# Patient Record
Sex: Female | Born: 1945 | Race: White | Hispanic: No | Marital: Single | Smoking: Former smoker
Health system: Southern US, Community
[De-identification: ages and names within clinical notes are randomized; demographics above are authoritative.]

## PROBLEM LIST (undated history)

## (undated) DIAGNOSIS — H6240 Otitis externa in other diseases classified elsewhere, unspecified ear: Secondary | ICD-10-CM

## (undated) DIAGNOSIS — R748 Abnormal levels of other serum enzymes: Secondary | ICD-10-CM

## (undated) DIAGNOSIS — M7750 Other enthesopathy of unspecified foot: Secondary | ICD-10-CM

## (undated) DIAGNOSIS — G4733 Obstructive sleep apnea (adult) (pediatric): Secondary | ICD-10-CM

## (undated) DIAGNOSIS — E119 Type 2 diabetes mellitus without complications: Secondary | ICD-10-CM

## (undated) DIAGNOSIS — E669 Obesity, unspecified: Secondary | ICD-10-CM

## (undated) DIAGNOSIS — E1129 Type 2 diabetes mellitus with other diabetic kidney complication: Secondary | ICD-10-CM

## (undated) DIAGNOSIS — G8929 Other chronic pain: Secondary | ICD-10-CM

## (undated) DIAGNOSIS — S93499A Sprain of other ligament of unspecified ankle, initial encounter: Secondary | ICD-10-CM

## (undated) DIAGNOSIS — F329 Major depressive disorder, single episode, unspecified: Secondary | ICD-10-CM

## (undated) DIAGNOSIS — M1990 Unspecified osteoarthritis, unspecified site: Secondary | ICD-10-CM

## (undated) DIAGNOSIS — K219 Gastro-esophageal reflux disease without esophagitis: Secondary | ICD-10-CM

## (undated) DIAGNOSIS — M25579 Pain in unspecified ankle and joints of unspecified foot: Secondary | ICD-10-CM

## (undated) DIAGNOSIS — M549 Dorsalgia, unspecified: Secondary | ICD-10-CM

## (undated) DIAGNOSIS — N183 Chronic kidney disease, stage 3 unspecified: Secondary | ICD-10-CM

## (undated) DIAGNOSIS — F419 Anxiety disorder, unspecified: Secondary | ICD-10-CM

## (undated) DIAGNOSIS — R0989 Other specified symptoms and signs involving the circulatory and respiratory systems: Secondary | ICD-10-CM

## (undated) DIAGNOSIS — C801 Malignant (primary) neoplasm, unspecified: Secondary | ICD-10-CM

## (undated) DIAGNOSIS — C52 Malignant neoplasm of vagina: Secondary | ICD-10-CM

## (undated) DIAGNOSIS — R21 Rash and other nonspecific skin eruption: Secondary | ICD-10-CM

## (undated) DIAGNOSIS — M898X9 Other specified disorders of bone, unspecified site: Secondary | ICD-10-CM

## (undated) DIAGNOSIS — H9319 Tinnitus, unspecified ear: Secondary | ICD-10-CM

## (undated) DIAGNOSIS — C4490 Unspecified malignant neoplasm of skin, unspecified: Secondary | ICD-10-CM

## (undated) DIAGNOSIS — E7800 Pure hypercholesterolemia, unspecified: Secondary | ICD-10-CM

## (undated) DIAGNOSIS — T792XXA Traumatic secondary and recurrent hemorrhage and seroma, initial encounter: Secondary | ICD-10-CM

## (undated) DIAGNOSIS — F29 Unspecified psychosis not due to a substance or known physiological condition: Secondary | ICD-10-CM

## (undated) DIAGNOSIS — F339 Major depressive disorder, recurrent, unspecified: Secondary | ICD-10-CM

## (undated) DIAGNOSIS — R1312 Dysphagia, oropharyngeal phase: Secondary | ICD-10-CM

## (undated) DIAGNOSIS — F32A Depression, unspecified: Secondary | ICD-10-CM

## (undated) DIAGNOSIS — I10 Essential (primary) hypertension: Secondary | ICD-10-CM

## (undated) DIAGNOSIS — L299 Pruritus, unspecified: Secondary | ICD-10-CM

## (undated) DIAGNOSIS — G2581 Restless legs syndrome: Secondary | ICD-10-CM

## (undated) DIAGNOSIS — S96819A Strain of other specified muscles and tendons at ankle and foot level, unspecified foot, initial encounter: Secondary | ICD-10-CM

## (undated) DIAGNOSIS — T8859XA Other complications of anesthesia, initial encounter: Secondary | ICD-10-CM

## (undated) DIAGNOSIS — L408 Other psoriasis: Secondary | ICD-10-CM

## (undated) DIAGNOSIS — Z720 Tobacco use: Secondary | ICD-10-CM

## (undated) DIAGNOSIS — H9110 Presbycusis, unspecified ear: Secondary | ICD-10-CM

## (undated) DIAGNOSIS — R0609 Other forms of dyspnea: Secondary | ICD-10-CM

## (undated) DIAGNOSIS — G43909 Migraine, unspecified, not intractable, without status migrainosus: Secondary | ICD-10-CM

## (undated) DIAGNOSIS — B369 Superficial mycosis, unspecified: Secondary | ICD-10-CM

## (undated) DIAGNOSIS — R51 Headache: Secondary | ICD-10-CM

## (undated) DIAGNOSIS — M175 Other unilateral secondary osteoarthritis of knee: Secondary | ICD-10-CM

## (undated) DIAGNOSIS — H60399 Other infective otitis externa, unspecified ear: Secondary | ICD-10-CM

## (undated) DIAGNOSIS — F319 Bipolar disorder, unspecified: Secondary | ICD-10-CM

## (undated) DIAGNOSIS — N390 Urinary tract infection, site not specified: Secondary | ICD-10-CM

## (undated) DIAGNOSIS — T4145XA Adverse effect of unspecified anesthetic, initial encounter: Secondary | ICD-10-CM

## (undated) HISTORY — DX: Restless legs syndrome: G25.81

## (undated) HISTORY — PX: FINGER AMPUTATION: SHX636

## (undated) HISTORY — DX: Chronic kidney disease, stage 3 (moderate): N18.3

## (undated) HISTORY — DX: Major depressive disorder, single episode, unspecified: F32.9

## (undated) HISTORY — DX: Other enthesopathy of unspecified foot and ankle: M77.50

## (undated) HISTORY — DX: Presbycusis, unspecified ear: H91.10

## (undated) HISTORY — DX: Dysphagia, oropharyngeal phase: R13.12

## (undated) HISTORY — DX: Other unilateral secondary osteoarthritis of knee: M17.5

## (undated) HISTORY — DX: Pure hypercholesterolemia, unspecified: E78.00

## (undated) HISTORY — DX: Obesity, unspecified: E66.9

## (undated) HISTORY — DX: Other specified symptoms and signs involving the circulatory and respiratory systems: R09.89

## (undated) HISTORY — DX: Type 2 diabetes mellitus without complications: E11.9

## (undated) HISTORY — DX: Abnormal levels of other serum enzymes: R74.8

## (undated) HISTORY — DX: Malignant (primary) neoplasm, unspecified: C80.1

## (undated) HISTORY — DX: Superficial mycosis, unspecified: B36.9

## (undated) HISTORY — DX: Tinnitus, unspecified ear: H93.19

## (undated) HISTORY — DX: Strain of other specified muscles and tendons at ankle and foot level, unspecified foot, initial encounter: S96.819A

## (undated) HISTORY — DX: Type 2 diabetes mellitus with other diabetic kidney complication: E11.29

## (undated) HISTORY — DX: Otitis externa in other diseases classified elsewhere, unspecified ear: H62.40

## (undated) HISTORY — DX: Pain in unspecified ankle and joints of unspecified foot: M25.579

## (undated) HISTORY — DX: Other forms of dyspnea: R06.09

## (undated) HISTORY — DX: Anxiety disorder, unspecified: F41.9

## (undated) HISTORY — DX: Migraine, unspecified, not intractable, without status migrainosus: G43.909

## (undated) HISTORY — DX: Chronic kidney disease, stage 3 unspecified: N18.30

## (undated) HISTORY — DX: Strain of other specified muscles and tendons at ankle and foot level, unspecified foot, initial encounter: S93.499A

## (undated) HISTORY — DX: Malignant neoplasm of vagina: C52

## (undated) HISTORY — DX: Other specified disorders of bone, unspecified site: M89.8X9

## (undated) HISTORY — DX: Pruritus, unspecified: L29.9

## (undated) HISTORY — DX: Obstructive sleep apnea (adult) (pediatric): G47.33

## (undated) HISTORY — PX: BLADDER SURGERY: SHX569

## (undated) HISTORY — PX: NASAL SEPTUM SURGERY: SHX37

## (undated) HISTORY — DX: Other chronic pain: G89.29

## (undated) HISTORY — DX: Gastro-esophageal reflux disease without esophagitis: K21.9

## (undated) HISTORY — DX: Essential (primary) hypertension: I10

## (undated) HISTORY — DX: Depression, unspecified: F32.A

## (undated) HISTORY — DX: Other infective otitis externa, unspecified ear: H60.399

## (undated) HISTORY — DX: Dorsalgia, unspecified: M54.9

## (undated) HISTORY — DX: Unspecified psychosis not due to a substance or known physiological condition: F29

## (undated) HISTORY — DX: Other psoriasis: L40.8

## (undated) HISTORY — DX: Major depressive disorder, recurrent, unspecified: F33.9

## (undated) HISTORY — DX: Traumatic secondary and recurrent hemorrhage and seroma, initial encounter: T79.2XXA

## (undated) HISTORY — DX: Tobacco use: Z72.0

---

## 1968-11-09 HISTORY — PX: TOTAL ABDOMINAL HYSTERECTOMY: SHX209

## 1998-02-27 ENCOUNTER — Ambulatory Visit: Admission: RE | Admit: 1998-02-27 | Discharge: 1998-02-27 | Payer: Self-pay | Admitting: Neurology

## 1998-03-01 ENCOUNTER — Other Ambulatory Visit: Admission: RE | Admit: 1998-03-01 | Discharge: 1998-03-01 | Payer: Self-pay

## 1998-07-07 ENCOUNTER — Ambulatory Visit: Admission: RE | Admit: 1998-07-07 | Discharge: 1998-07-07 | Payer: Self-pay | Admitting: Internal Medicine

## 2000-04-28 ENCOUNTER — Encounter: Payer: Self-pay | Admitting: Internal Medicine

## 2000-04-28 ENCOUNTER — Ambulatory Visit (HOSPITAL_BASED_OUTPATIENT_CLINIC_OR_DEPARTMENT_OTHER): Admission: RE | Admit: 2000-04-28 | Discharge: 2000-04-28 | Payer: Self-pay | Admitting: Internal Medicine

## 2001-03-30 ENCOUNTER — Ambulatory Visit (HOSPITAL_BASED_OUTPATIENT_CLINIC_OR_DEPARTMENT_OTHER): Admission: RE | Admit: 2001-03-30 | Discharge: 2001-03-31 | Payer: Self-pay | Admitting: Internal Medicine

## 2004-10-01 ENCOUNTER — Ambulatory Visit: Payer: Self-pay | Admitting: Family Medicine

## 2004-10-22 ENCOUNTER — Ambulatory Visit: Payer: Self-pay | Admitting: Family Medicine

## 2004-11-26 ENCOUNTER — Ambulatory Visit: Payer: Self-pay | Admitting: Family Medicine

## 2005-01-14 ENCOUNTER — Ambulatory Visit: Payer: Self-pay | Admitting: Family Medicine

## 2005-02-13 ENCOUNTER — Ambulatory Visit: Payer: Self-pay | Admitting: Family Medicine

## 2005-04-02 ENCOUNTER — Ambulatory Visit: Payer: Self-pay | Admitting: Family Medicine

## 2005-04-29 ENCOUNTER — Ambulatory Visit: Payer: Self-pay | Admitting: Family Medicine

## 2005-08-31 ENCOUNTER — Ambulatory Visit: Payer: Self-pay | Admitting: Family Medicine

## 2006-01-26 ENCOUNTER — Ambulatory Visit: Payer: Self-pay | Admitting: Family Medicine

## 2006-02-02 ENCOUNTER — Ambulatory Visit: Payer: Self-pay | Admitting: Family Medicine

## 2006-03-23 ENCOUNTER — Encounter: Payer: Self-pay | Admitting: Internal Medicine

## 2006-07-20 ENCOUNTER — Encounter: Admission: RE | Admit: 2006-07-20 | Discharge: 2006-07-20 | Payer: Self-pay | Admitting: Unknown Physician Specialty

## 2010-08-12 ENCOUNTER — Ambulatory Visit: Payer: Self-pay | Admitting: Internal Medicine

## 2010-08-12 DIAGNOSIS — G4733 Obstructive sleep apnea (adult) (pediatric): Secondary | ICD-10-CM | POA: Insufficient documentation

## 2010-08-26 ENCOUNTER — Encounter: Payer: Self-pay | Admitting: Internal Medicine

## 2010-09-07 ENCOUNTER — Encounter: Payer: Self-pay | Admitting: Internal Medicine

## 2010-09-23 ENCOUNTER — Ambulatory Visit: Payer: Self-pay | Admitting: Internal Medicine

## 2010-12-09 NOTE — Assessment & Plan Note (Signed)
Summary: 1 month/ mbw   Primary Provider/Referring Provider:  Durel Salts, MD/ [place]  CC:  1 month follow up visit-sleep; Review CPAP compliance report.Marland Kitchen  History of Present Illness: CC:  Sleep new patient-reestablish(former [place] chest patient).  History of Present Illness: August 12, 2010- 64 yoF, formerly a patient at [facility] with sleep apnea, now wanting to reestablish with me. Diagnostic NPSG 04/28/00- QHI 18.3/hr. Was working with Dr Blenda Nicely in [place] with most recent CPAP titration to 9 cwp in 2007. [facility]. Gradually feeling smothered by her old CPAP machine that she says blows too much air. Boy friend says she is not snoring. Sleepy in daytime.  Bedtime 830-900PM, sleep latency 5 minutes, waking twice before up 4AM or later. Weight up 30 lbs in recent years.   September 23, 2010- OSA  Here for: 1 month follow up visit-sleep; Review CPAP compliance report. Got a new, smaller full face CPAP mask from [facility] and it seems to work better.  Compliance report shows excellent compliance and control, with presure set at 9. Dr Blenda Nicely had previoulsy set her on 9 which she thought blew too much air. She got a new machine 4 months ago and is now doing well.  Does note exertional dyspnea with short walks- she blames back trouble for limiting her exercise.    Preventive Screening-Counseling & Management  Alcohol-Tobacco     Smoking Status: quit     Smoking Cessation Counseling: yes     Packs/Day: <0.25     Year Quit: 12 years     Tobacco Counseling: to quit use of tobacco products  Current Medications (verified): 1)  Requip 0.25 Mg Tabs (Ropinirole Hcl) .... Take 1 By Mouth Once Daily 2)  Meloxicam 15 Mg Tabs (Meloxicam) .... Take 1 By Mouth Once Daily 3)  Astelin 137 Mcg/spray Soln (Azelastine Hcl) .Marland Kitchen.. 1 Spray in Each Nostril Two Times A Day 4)  Gabapentin 100 Mg Caps (Gabapentin) .... Take 1 By Mouth At Bedtime 5)  Wellbutrin Sr 200 Mg Xr12h-Tab (Bupropion Hcl) .... Take 1 By Mouth Two  Times A Day 6)  Pravastatin Sodium 80 Mg Tabs (Pravastatin Sodium) .... Take 1 By Mouth Once Daily 7)  Verapamil Hcl Cr 120 Mg Cr-Tabs (Verapamil Hcl) .... Take 1 By Mouth Once Daily 8)  Cpap Set On 9 .... [facility]  Allergies (verified): 1)  ! Valium 2)  ! Sulfa 3)  ! Pcn 4)  ! * Trilafon  Past History:  Past Medical History: Last updated: 08/12/2010 Obstructive sleep apnea- NPSG 04/28/00- AHI 18.3/hr Tobacco user Depression Chronic back pain GERD Obesity Hx vaginal cancer  Past Surgical History: Last updated: 08/12/2010 Nasal septoplasty Hysterectomy Bladder surgery  Family History: Last updated: 08/12/2010 Family hx of: emphysema,allergies,asthma,heart disease, and cancer. Mother- died old age Father- died asthma  Social History: Last updated: 08/12/2010 Widowed, children,  lives with boy friend Current smoker 1 pk per month Quit ETOH-17 years ago. Disabled- back pain  Risk Factors: Smoking Status: quit (09/23/2010) Packs/Day: <0.25 (09/23/2010)  Social History: Smoking Status:  quit  Review of Systems      See HPI       The patient complains of sleep apnea.  The patient denies fever, vision loss, decreased hearing, hoarseness, chest pain, prolonged cough, headaches, hemoptysis, abdominal pain, melena, hematochezia, and severe indigestion/heartburn.    Vital Signs:  Patient profile:   65 year old female Height:      60 inches Weight:      265.25 pounds BMI:  51.99 O2 Sat:      97 % on Room air Pulse rate:   105 / minute BP sitting:   148 / 70  (left arm) Cuff size:   large  Vitals Entered By: Reynaldo Minium CMA (September 23, 2010 11:08 AM)  O2 Flow:  Room air CC: 1 month follow up visit-sleep; Review CPAP compliance report.   Physical Exam  Additional Exam:  General: A/Ox3; pleasant and cooperative, NAD, oveweight SKIN: no rash, lesions NODES: no lymphadenopathy HEENT: [place]/AT, EOM- WNL, Conjuctivae- clear, PERRLA, TM-WNL, Nose- clear, Throat-  clear and wnl, dentures, Mallampati  III NECK: Supple w/ fair ROM, JVD- none, normal carotid impulses w/o bruits Thyroid- normal to palpation CHEST: Clear to P&A HEART: RRR, no m/g/r heard ABDOMEN: Seriously overweight. ZOX:WRUE, nl pulses, no edema  NEURO: Grossly intact to observation      Impression & Recommendations:  Problem # 1:  OBSTRUCTIVE SLEEP APNEA (ICD-327.23)  Great compliance and control. Her current machine is set on 9 and the mask is comfortable. We will leave CPAP where it is and try to motivate her to make a real diet and exercise effort to lose weight. Discussed sleep hygiene, alternatives to CPAP, driving safety.  Medications Added to Medication List This Visit: 1)  Cpap Set On 9  .... [facility]  Other Orders: Est. Patient Level III (45409) DME Referral (DME)  Patient Instructions: 1)  Please schedule a follow-up appointment in 1 year. 2)  We will have [facility] leave your CPAP set at 9 for now. If that isn't ok, please let me know.  3)  I think you will really feel better and sleep better if you can eat less, walk more, and lose some weight.

## 2010-12-09 NOTE — Assessment & Plan Note (Signed)
Summary: sleep apnea/ mbw   Primary Provider/Referring Provider:  Durel Salts, MD/ [place]  CC:  Sleep new patient-reestablish(former [place] chest patient).  History of Present Illness: August 12, 2010- 64 yoF, formerly a patient at [facility] with sleep apnea, now wanting to reestablish with me. Diagnostic NPSG 04/28/00- QHI 18.3/hr. Was working with Dr Blenda Nicely in [place] at [place] with most recent CPAP titration to 9 cwp in 2007. [facility]. Gradually feeling smothered by her old CPAP machine that she says blows too much air. Boy friend says she is not snoring. Sleepy in daytime.  Bedtime 830-900PM, sleep latency 5 minutes, waking twice before up 4AM or later. Weight up 30 lbs in recent years.  Hx difficulty extubating her after bladder surgery in past year.Past hx septoplasty.  Preventive Screening-Counseling & Management  Alcohol-Tobacco     Smoking Status: current     Smoking Cessation Counseling: yes     Packs/Day: <0.25     Tobacco Counseling: to quit use of tobacco products  Current Medications (verified): 1)  Requip 0.25 Mg Tabs (Ropinirole Hcl) .... Take 1 By Mouth Once Daily 2)  Meloxicam 15 Mg Tabs (Meloxicam) .... Take 1 By Mouth Once Daily 3)  Astelin 137 Mcg/spray Soln (Azelastine Hcl) .Marland Kitchen.. 1 Spray in Each Nostril Two Times A Day 4)  Gabapentin 100 Mg Caps (Gabapentin) .... Take 1 By Mouth At Bedtime 5)  Ginkoba 40 Mg Tabs (Ginkgo Biloba) .... Take 80mg  Tid 6)  Wellbutrin Sr 200 Mg Xr12h-Tab (Bupropion Hcl) .... Take 1 By Mouth Two Times A Day 7)  Pravastatin Sodium 80 Mg Tabs (Pravastatin Sodium) .... Take 1 By Mouth Once Daily 8)  Verapamil Hcl Cr 120 Mg Cr-Tabs (Verapamil Hcl) .... Take 1 By Mouth Once Daily  Allergies (verified): 1)  ! Valium 2)  ! Sulfa 3)  ! Pcn 4)  ! * Trilafon  Past History:  Past Medical History: Obstructive sleep apnea- NPSG 04/28/00- AHI 18.3/hr Tobacco user Depression Chronic back pain GERD Obesity Hx vaginal cancer  Past Surgical History: Nasal  septoplasty Hysterectomy Bladder surgery  Family History: Family hx of: emphysema,allergies,asthma,heart disease, and cancer. Mother- died old age Father- died asthma  Social History: Widowed, children,  lives with boy friend Current smoker 1 pk per month Quit ETOH-17 years ago. Disabled- back painSmoking Status:  current Packs/Day:  <0.25  Review of Systems      See HPI       The patient complains of shortness of breath with activity, acid heartburn, indigestion, difficulty swallowing, headaches, nasal congestion/difficulty breathing through nose, ear ache, anxiety, depression, and joint stiffness or pain.  The patient denies shortness of breath at rest, productive cough, non-productive cough, coughing up blood, chest pain, irregular heartbeats, loss of appetite, weight change, abdominal pain, sore throat, tooth/dental problems, sneezing, itching, hand/feet swelling, rash, change in color of mucus, and fever.    Vital Signs:  Patient profile:   65 year old female Height:      60 inches Weight:      261.38 pounds BMI:     51.23 O2 Sat:      98 % on Room air Pulse rate:   67 / minute BP sitting:   124 / 74  (left arm) Cuff size:   regular  Vitals Entered By: Reynaldo Minium CMA (August 12, 2010 2:35 PM)  O2 Flow:  Room air CC: Sleep new patient-reestablish(former [facility] chest patient) Comments B/P taken on wrist with Regular cuff.Reynaldo Minium CMA  August 12, 2010 2:36  PM    Physical Exam  Additional Exam:  General: A/Ox3; pleasant and cooperative, NAD, oveweight SKIN: no rash, lesions NODES: no lymphadenopathy HEENT: [place]/AT, EOM- WNL, Conjuctivae- clear, PERRLA, TM-WNL, Nose- clear, Throat- clear and wnl, dentures, Mallampati  III NECK: Supple w/ fair ROM, JVD- none, normal carotid impulses w/o bruits Thyroid- normal to palpation CHEST: Clear to P&A HEART: RRR, no m/g/r heard ABDOMEN: Soft and nl; nml bowel sounds; no organomegaly or masses noted, obese BJY:NWGN, nl  pulses, no edema  NEURO: Grossly intact to observation      Impression & Recommendations:  Problem # 1:  OBSTRUCTIVE SLEEP APNEA (ICD-327.23) She has been compliant with cpap and in fairly good control. Either her machine is wearing out, or her weight gain is moving her out of control in the current pressure range.  We will autotitrate for pressure check Weight loss is advised Alternatives to cpap, importance of good sleep hygiene, and driving safety were all emphasized.   Medications Added to Medication List This Visit: 1)  Requip 0.25 Mg Tabs (Ropinirole hcl) .... Take 1 by mouth once daily 2)  Meloxicam 15 Mg Tabs (Meloxicam) .... Take 1 by mouth once daily 3)  Astelin 137 Mcg/spray Soln (Azelastine hcl) .Marland Kitchen.. 1 spray in each nostril two times a day 4)  Gabapentin 100 Mg Caps (Gabapentin) .... Take 1 by mouth at bedtime 5)  Ginkoba 40 Mg Tabs (Ginkgo biloba) .... Take 80mg  tid 6)  Wellbutrin Sr 200 Mg Xr12h-tab (Bupropion hcl) .... Take 1 by mouth two times a day 7)  Pravastatin Sodium 80 Mg Tabs (Pravastatin sodium) .... Take 1 by mouth once daily 8)  Verapamil Hcl Cr 120 Mg Cr-tabs (Verapamil hcl) .... Take 1 by mouth once daily  Other Orders: New Patient Level IV (56213) DME Referral (DME)  Patient Instructions: 1)  Please schedule a follow-up appointment in 1 month. 2)  See [facility] to set up autotitration of CPAP

## 2010-12-09 NOTE — Letter (Signed)
Summary: CMN for CPAP Supply/[facility]  CMN for CPAP Supply/[facility]   Imported By: Sherian Rein 09/10/2010 14:16:20  _____________________________________________________________________  External Attachment:    Type:   Image     Comment:   External Document

## 2011-03-10 ENCOUNTER — Telehealth: Payer: Self-pay | Admitting: Internal Medicine

## 2011-03-10 NOTE — Telephone Encounter (Signed)
Get a new one for November. Patient's Medicare & Medicaid will not pay for the repairs. She can be reached at [phone].Chelsea [name]

## 2011-03-10 NOTE — Telephone Encounter (Signed)
Spoke w/ pt and she states her cpap machine on and off button has a shortage in it. Pt contacted [facility] and they advised her she would have to send machine in and would have to pay for the repairs. Pt states she is renting the machine through [place]. i asked pt if she has contacted [facility] and she states she has not. Pt states she is going to contact [facility] now and see what they tell her. Pt states she will call back if she needs [place] further

## 2011-03-10 NOTE — Telephone Encounter (Signed)
Spoke with pt.  She states having issues and she states that she is having issues with her CPAP machine- the on and off button is coming off and she has to get up in the night and turn machine back on.  She states that she uses [facility] and she has called them and they just advised to mail machine back, but she does not want to do this b/c then she will have to pay for shipping and she really just wants a new machine.  Please advise thanks!

## 2011-03-23 NOTE — Telephone Encounter (Signed)
All I can do is write script for replacement machine. She could carry the [facility] herself. She would have to work out something with them for a loner, time payments, used machine or etc.

## 2011-03-24 NOTE — Telephone Encounter (Signed)
Called and spoke with patient about broken cpap. Pt stated that she got this machine from [facility], phone # [phone]. Pt states that she was going to Dr. Blenda Nicely in [place], who is the physician who ordered this cpap with  Sleep. Pt states that she is no longer going to Dr. Blenda Nicely and she has an appt with Dr. Maple Hudson in Nov. 2012. Pt stated that [facility] has advised her that her insurance will not pay for her to have another machine until Nov. 2012. I have called and LM for someone at [facility] to call me about this issue. Pt stated that to her knowledge her machine has not been dropped. Waiting on [facility] to return my call. Rhonda Cobb

## 2011-03-24 NOTE — Telephone Encounter (Signed)
Bjorn Loser have you heard anything else from this?

## 2011-03-25 NOTE — Telephone Encounter (Signed)
Called and spoke with Kathlene November at [facility]. Pt's cpap dme company has changed to [facility] in [place]. Called [facility] and spoke with Beth. Beth stated that cpap was issued on 04/15/06 and that she would be eligible for a new CPAP in June 2012. If pt brought cpap machine in for repair it may be a cost to repair the device that patient would have an option to get a new cpap in June. Spoke with pt and she wanted to continue with her old cpap until June and will call our office back in June to request a Rx for replacement cpap to be sent to [facility] in [place].

## 2011-04-21 ENCOUNTER — Other Ambulatory Visit: Payer: Self-pay | Admitting: Internal Medicine

## 2011-04-21 ENCOUNTER — Telehealth: Payer: Self-pay | Admitting: Internal Medicine

## 2011-04-21 DIAGNOSIS — G4733 Obstructive sleep apnea (adult) (pediatric): Secondary | ICD-10-CM

## 2011-04-21 NOTE — Telephone Encounter (Signed)
Spoke with Shanda Bumps at [facility] and advised her to shred order that patient was established with [facility] in [place]. Re-faxed order and study to [facility] and spoke with Tresa Endo and verified fax number with her. Faxed to [phone]. Pt is aware that order with [facility] has been D/C and pt advised to call me if she hasn't heard from [facility] by Monday, June 18th.

## 2011-04-29 ENCOUNTER — Telehealth: Payer: Self-pay | Admitting: Internal Medicine

## 2011-04-29 NOTE — Telephone Encounter (Signed)
Called and spoke with Chelsea [name] at [facility] in [place], advised her of our telephone conversation in May 2012, where she checked her records and advised [place] that patient would be eligible for a new cpap in June 2012. Her old machine was furnished by [facility] in June of 2007, which per [place], pt would be able to get a new cpap rather than sending old machine off and taking a chance that it would cost more to repair the old cpap rather than wait until June to get a new one. Re-faxed order along with sleep study to Chelsea [name]'s attention at [phone]. Called Chelsea [name] back and she stated that the order was there. Asked Chelsea [name] to please process asap b/c pt has been without cpap for approx. 2 months.  Called and spoke with patient and made her aware of this conversation with [facility] and advised patient to call me next week if she hasn't received her new cpap. Pt voiced understanding.

## 2011-05-04 ENCOUNTER — Encounter: Payer: Self-pay | Admitting: Internal Medicine

## 2011-09-21 ENCOUNTER — Encounter: Payer: Self-pay | Admitting: Internal Medicine

## 2011-09-22 ENCOUNTER — Encounter: Payer: Self-pay | Admitting: Internal Medicine

## 2011-09-22 ENCOUNTER — Ambulatory Visit (INDEPENDENT_AMBULATORY_CARE_PROVIDER_SITE_OTHER): Payer: Medicare Other | Admitting: Internal Medicine

## 2011-09-22 DIAGNOSIS — J984 Other disorders of lung: Secondary | ICD-10-CM

## 2011-09-22 DIAGNOSIS — R911 Solitary pulmonary nodule: Secondary | ICD-10-CM

## 2011-09-22 DIAGNOSIS — G4733 Obstructive sleep apnea (adult) (pediatric): Secondary | ICD-10-CM

## 2011-09-22 DIAGNOSIS — E119 Type 2 diabetes mellitus without complications: Secondary | ICD-10-CM

## 2011-09-22 HISTORY — DX: Type 2 diabetes mellitus without complications: E11.9

## 2011-09-22 NOTE — Progress Notes (Signed)
09/22/11- 65 yo F former smoker followed for obstructive sleep apnea complicated by history of depression, chronic back pain, GERD, obesity, DM.- PCP Dr Durel Salts Last here 09/23/2010 Since last here she has been diagnosed with diabetes. Has had flu vaccine. 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 She went back to using her old CPAP with a bad switch, set at 60 / [place]. She does use it all night every night and during naps is comfortable with it. Being followed at [facility] for a nodule on right upper lobe, every 3 months with no biopsy.  ROS-see HPI Constitutional:   No-   weight loss, night sweats, fevers, chills, fatigue, lassitude. HEENT:   No-  headaches, difficulty swallowing, tooth/dental problems, sore throat,       No-  sneezing, itching, ear ache, nasal congestion, post nasal drip,  CV:  No-   chest pain, orthopnea, PND, swelling in lower extremities, anasarca,                                  dizziness, palpitations Resp: No- acute  shortness of breath with exertion or at rest.              No-   productive cough,  No non-productive cough,  No- coughing up of blood.              No-   change in color of mucus.  No- wheezing.   Skin: No-   rash or lesions. GI:  No-   heartburn, indigestion, abdominal pain, nausea, vomiting, diarrhea,                 change in bowel habits, loss of appetite GU: No-   dysuria, change in color of urine, no urgency or frequency.  No- flank pain. MS:  No-   joint pain or swelling.  No- decreased range of motion.  No- back pain. Neuro-     nothing unusual Psych:  No- change in mood or affect. No depression or anxiety.  No memory loss.  OBJ General- Alert, Oriented, Affect-appropriate, Distress- none acute; obese Skin- rash-none, lesions- none, excoriation- none Lymphadenopathy- none Head- atraumatic            Eyes- Gross vision intact, PERRLA, conjunctivae clear secretions            Ears- Hearing, canals-normal       Nose- Clear, no-Septal dev, mucus, polyps, erosion, perforation             Throat- Mallampati III-IV , mucosa clear , drainage- none, tonsils- atrophic, dentures Neck- flexible , trachea midline, no stridor , thyroid nl, carotid no bruit Chest - symmetrical excursion , unlabored           Heart/CV- RRR , no murmur , no gallop  , no rub, nl s1 s2                           - JVD- none , edema- none, stasis changes- none, varices- none           Lung- clear to P&A, wheeze- none, cough- none , dullness-none, rub- none           Chest wall-  Abd- tender-no, distended-no, bowel sounds-present, HSM- no Br/ Gen/ Rectal- Not done, not indicated Extrem- cyanosis- none, clubbing, none, atrophy- none, strength- nl. Surgical  absence left middle finger. Neuro- grossly intact to observation

## 2011-09-22 NOTE — Patient Instructions (Signed)
Order- DME- [facility]- replacement CPAP machine and supplies 9 cwp             We will pull old paper chart for original sleep studies for documentation  Please call as needed

## 2011-09-26 ENCOUNTER — Encounter: Payer: Self-pay | Admitting: Internal Medicine

## 2011-09-26 DIAGNOSIS — R911 Solitary pulmonary nodule: Secondary | ICD-10-CM | POA: Insufficient documentation

## 2011-09-26 NOTE — Assessment & Plan Note (Signed)
We will check the archives for an original diagnostic sleep study needed for replacement of her CPAP machine which has a switch bowel function. Otherwise she has good compliance and control.

## 2012-09-21 ENCOUNTER — Encounter: Payer: Self-pay | Admitting: Internal Medicine

## 2012-09-21 ENCOUNTER — Ambulatory Visit (INDEPENDENT_AMBULATORY_CARE_PROVIDER_SITE_OTHER): Payer: Medicare Other | Admitting: Internal Medicine

## 2012-09-21 VITALS — BP 118/64 | HR 82 | Ht 60.0 in | Wt 251.6 lb

## 2012-09-21 DIAGNOSIS — R911 Solitary pulmonary nodule: Secondary | ICD-10-CM

## 2012-09-21 DIAGNOSIS — G4733 Obstructive sleep apnea (adult) (pediatric): Secondary | ICD-10-CM

## 2012-09-21 DIAGNOSIS — Z23 Encounter for immunization: Secondary | ICD-10-CM

## 2012-09-21 NOTE — Patient Instructions (Addendum)
We can continue CPAP 9/ [facility]  If you show the skin tags on your neck to Dr Sol Passer, he may be able to zap them off, or send you to a dermatologist who can do it. Anything else you can do to cover your CPAP strap to stop rubbing will be fine. If you show the places on your neck to [facility], they might have a mask with different straps to avoid rubbing there as well.  Please discuss your nerves and tearfulness with Dr Sol Passer  Flu vax

## 2012-09-21 NOTE — Progress Notes (Signed)
09/22/11- 66 yo F former smoker followed for obstructive sleep apnea complicated by history of depression, chronic back pain, GERD, obesity, DM.- PCP Dr Durel Salts Last here 09/23/2010 Since last here she has been diagnosed with diabetes. Has had flu vaccine. 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 She went back to using her old CPAP with a bad switch, set at 18 / [place]. She does use it all night every night and during naps is comfortable with it. Being followed at [facility] for a nodule on right upper lobe, every 3 months with no biopsy.  09/21/12-66 yo F former smoker followed for obstructive sleep apnea complicated by history of depression, chronic back pain, GERD, obesity, DM., lung nodule ([facility]) .- PCP Dr Sol Passer Wears CPAP 9/[facility] every night and during the day if taking a nap. Pressure working well for patient. Straps rub her skin tags. She treats limb jerks with a mail order herbal product retaining magnesium. 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 Much anxiety, gets tearful easily.  ROS-see HPI Constitutional:   No-   weight loss, night sweats, fevers, chills, fatigue, lassitude. HEENT:   No-  headaches, difficulty swallowing, tooth/dental problems, sore throat,       No-  sneezing, itching, ear ache, nasal congestion, post nasal drip,  CV:  No-   chest pain, orthopnea, PND, swelling in lower extremities, anasarca, dizziness, palpitations Resp: No- acute  shortness of breath with exertion or at rest.              No-   productive cough,  No non-productive cough,  No- coughing up of blood.              No-   change in color of mucus.  No- wheezing.   Skin: No-   rash or lesions. GI:  No-   heartburn, indigestion, abdominal pain, nausea, vomiting,  GU:  MS:  No-   joint pain or swelling.   Neuro-     nothing unusual Psych:  No- change in mood or affect. No depression or  anxiety.  No memory loss.  OBJ General- Alert, Oriented, Affect-appropriate, Distress- none acute; obese. Walker Skin- rash-none, lesions- none, excoriation- none Lymphadenopathy- none Head- atraumatic            Eyes- Gross vision intact, PERRLA, conjunctivae clear secretions            Ears- Hearing, canals-normal            Nose- Clear, no-Septal dev, mucus, polyps, erosion, perforation             Throat- Mallampati III-IV , mucosa clear , drainage- none, tonsils- atrophic, dentures Neck- flexible , trachea midline, no stridor , thyroid nl, carotid no bruit Chest - symmetrical excursion , unlabored           Heart/CV- RRR , no murmur , no gallop  , no rub, nl s1 s2                           - JVD- none , edema- none, stasis changes- none, varices- none           Lung- clear to P&A, wheeze- none, cough- none , dullness-none, rub- none           Chest wall-  Abd-  Br/ Gen/ Rectal- Not  done, not indicated Extrem- cyanosis- none, clubbing, none, atrophy- none, strength- nl. Surgical absence left middle finger. Neuro- grossly intact to observation

## 2012-10-01 NOTE — Assessment & Plan Note (Signed)
Good compliance and control. Weight loss would help.  

## 2012-10-01 NOTE — Assessment & Plan Note (Signed)
Question whether this is related to previous malignancy requiring amputation of her finger. [facility] is managing this problem.

## 2013-02-27 ENCOUNTER — Encounter (HOSPITAL_COMMUNITY): Payer: Self-pay | Admitting: Pharmacy Technician

## 2013-02-27 NOTE — Progress Notes (Signed)
Need roders for 5-6 surgery in epic pre op is 03-08-13 thanks

## 2013-03-03 NOTE — Progress Notes (Signed)
Dr Charlann Boxer-  NEED PRE OP ORDERS PLEASE-  HAS APPT 03/08/13  THANKS

## 2013-03-08 ENCOUNTER — Other Ambulatory Visit: Payer: Self-pay

## 2013-03-08 ENCOUNTER — Encounter (HOSPITAL_COMMUNITY)
Admission: RE | Admit: 2013-03-08 | Discharge: 2013-03-08 | Disposition: A | Discharge status: Home / Self Care | Payer: Medicare Other | Source: Ambulatory Visit | Attending: Orthopedic Surgery | Admitting: Orthopedic Surgery

## 2013-03-08 ENCOUNTER — Encounter (HOSPITAL_COMMUNITY): Payer: Self-pay

## 2013-03-08 HISTORY — DX: Unspecified malignant neoplasm of skin, unspecified: C44.90

## 2013-03-08 HISTORY — DX: Bipolar disorder, unspecified: F31.9

## 2013-03-08 HISTORY — DX: Rash and other nonspecific skin eruption: R21

## 2013-03-08 HISTORY — DX: Headache: R51

## 2013-03-08 HISTORY — DX: Unspecified osteoarthritis, unspecified site: M19.90

## 2013-03-08 HISTORY — DX: Other complications of anesthesia, initial encounter: T88.59XA

## 2013-03-08 HISTORY — DX: Urinary tract infection, site not specified: N39.0

## 2013-03-08 HISTORY — DX: Adverse effect of unspecified anesthetic, initial encounter: T41.45XA

## 2013-03-08 LAB — BASIC METABOLIC PANEL
BUN: 18 mg/dL (ref 6–23)
CO2: 27 mEq/L (ref 19–32)
Calcium: 9.9 mg/dL (ref 8.4–10.5)
Chloride: 104 mEq/L (ref 96–112)
Creatinine, Ser: 1.1 mg/dL (ref 0.50–1.10)
GFR calc Af Amer: 59 mL/min — ABNORMAL LOW (ref >90)
GFR calc non Af Amer: 51 mL/min — ABNORMAL LOW (ref >90)
Glucose, Bld: 176 mg/dL — ABNORMAL HIGH (ref 70–99)
Potassium: 4.2 mEq/L (ref 3.5–5.1)
Sodium: 142 mEq/L (ref 135–145)

## 2013-03-08 LAB — CBC
HCT: 40.6 % (ref 36.0–46.0)
Hemoglobin: 13.5 g/dL (ref 12.0–15.0)
MCH: 28.7 pg (ref 26.0–34.0)
MCHC: 33.3 g/dL (ref 30.0–36.0)
MCV: 86.4 fL (ref 78.0–100.0)
Platelets: 260 10*3/uL (ref 150–400)
RBC: 4.7 MIL/uL (ref 3.87–5.11)
RDW: 13.3 % (ref 11.5–15.5)
WBC: 9.1 10*3/uL (ref 4.0–10.5)

## 2013-03-08 LAB — URINALYSIS, ROUTINE W REFLEX MICROSCOPIC
Bilirubin Urine: NEGATIVE
Glucose, UA: NEGATIVE mg/dL
Ketones, ur: NEGATIVE mg/dL
Nitrite: POSITIVE — AB
Protein, ur: NEGATIVE mg/dL
Specific Gravity, Urine: 1.016 (ref 1.005–1.030)
Urobilinogen, UA: 1 mg/dL (ref 0.0–1.0)
pH: 6 (ref 5.0–8.0)

## 2013-03-08 LAB — APTT: aPTT: 36 seconds (ref 24–37)

## 2013-03-08 LAB — PROTIME-INR
INR: 1.05 (ref 0.00–1.49)
Prothrombin Time: 13.6 seconds (ref 11.6–15.2)

## 2013-03-08 LAB — URINE MICROSCOPIC-ADD ON

## 2013-03-08 LAB — SURGICAL PCR SCREEN
MRSA, PCR: NEGATIVE
Staphylococcus aureus: POSITIVE — AB

## 2013-03-08 LAB — ABO/RH: ABO/RH(D): O POS

## 2013-03-08 NOTE — Patient Instructions (Signed)
Chelsea [name]  03/08/2013                           YOUR PROCEDURE IS SCHEDULED ON: 03/14/13               PLEASE REPORT TO SHORT STAY CENTER AT : 6:00 AM               CALL THIS NUMBER IF ANY PROBLEMS THE DAY OF SURGERY :               [phone]                      REMEMBER:   Do not eat food or drink liquids AFTER MIDNIGHT   Take these medicines the morning of surgery with A SIP OF WATER:  VENLAFAXINE / LIPITOR / WELLBUTRIN / NITROFURANTOIN   Do not wear jewelry, make-up   Do not wear lotions, powders, or perfumes.   Do not shave legs or underarms 12 hrs. before surgery (men may shave face)  Do not bring valuables to the hospital.  Contacts, dentures or bridgework may not be worn into surgery.  Leave suitcase in the car. After surgery it may be brought to your room.  For patients admitted to the hospital more than one night, checkout time is 11:00                          The day of discharge.   Patients discharged the day of surgery will not be allowed to drive home                             If going home same day of surgery, must have someone stay with you first                           24 hrs at home and arrange for some one to drive you home from hospital.    Special Instructions:   Please read over the following fact sheets that you were given:               1. MRSA  INFORMATION                      2.  PREPARING FOR SURGERY SHEET               3. INCENTIVE SPIROMETER               4. BRING C PAP MASK AND TUBING TO HOSPITAL                                                X_____________________________________________________________________        Failure to follow these instructions may result in cancellation of your surgery

## 2013-03-08 NOTE — H&P (Signed)
TOTAL KNEE ADMISSION H&P  Patient is being admitted for right total knee arthroplasty.  Subjective:  Chief Complaint: Right knee OA / pain.  HPI: Chelsea [name], 67 y.o. female, has a history of pain and functional disability in the right knee due to arthritis and has failed non-surgical conservative treatments for greater than 12 weeks to includeNSAID's and/or analgesics, corticosteriod injections, viscosupplementation injections, use of assistive devices and activity modification.  Onset of symptoms was gradual, starting 2 years ago with gradually worsening course since that time. The patient noted no past surgery on the right knee(s).  Patient currently rates pain in the right knee(s) at 10 out of 10 with activity. Patient has night pain, worsening of pain with activity and weight bearing, pain that interferes with activities of daily living, pain with passive range of motion, crepitus and joint swelling.  Patient has evidence of periarticular osteophytes and joint space narrowing by imaging studies. There is no active infection. Risks, benefits and expectations were discussed with the patient. Patient understand the risks, benefits and expectations and wishes to proceed with surgery.   D/C Plans:   Home with HHPT  Post-op Meds:   No Rx given  Tranexamic Acid:   To be given  Decadron:    Not to be given - DM  FYI:    CPAP will need to be ordered, knows to bring mask and tubing   Patient Active Problem List   Diagnosis Date Noted  . Lung nodule 09/26/2011  . OBSTRUCTIVE SLEEP APNEA 08/12/2010   Past Medical History  Diagnosis Date  . Tobacco user   . Chronic back pain   . GERD (gastroesophageal reflux disease)   . Obesity   . Vaginal cancer   . DM II (diabetes mellitus, type II), controlled 09/22/11  . Headache     MIGRAINES  . Arthritis   . Rash     L ARM  . Recurrent UTI (urinary tract infection)   . OSA (obstructive sleep apnea)     USES C-PAP  . Depression   . Bipolar  disorder   . Skin cancer     L HAND  . Complication of anesthesia     "I GOT TOO MUCH MEDICINE AND I DIED TWICE" (ANESTHESIA NOTES ON CHART)    Past Surgical History  Procedure Laterality Date  . Nasal septum surgery    . Total abdominal hysterectomy    . Bladder surgery      X 3   . 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 Diazepam Other (See Comments)    MOUTH SWELLING  . Penicillins Rash  . Sulfonamide Derivatives Rash    History  Substance Use Topics  . Smoking status: Former Smoker -- 1.00 packs/day for 9 years    Types: Cigarettes    Quit date: 11/09/2005  . Smokeless tobacco: Never Used     Comment: smokes 1 pack a month  . Alcohol Use: No     Comment: quit 17 years ago    Family History  Problem Relation Age of Onset  . Emphysema    . Allergies    . Asthma    . Heart disease    . Cancer       Review of Systems  Constitutional: Positive for malaise/fatigue.  Eyes: Negative.   Respiratory: Positive for cough.   Cardiovascular: Negative.  Gastrointestinal: Positive for diarrhea.  Genitourinary: Positive for dysuria, urgency and frequency.  Musculoskeletal: Positive for back pain and joint pain.  Skin: Positive for itching and rash.  Neurological: Positive for headaches.  Endo/Heme/Allergies: Negative.   Psychiatric/Behavioral: Negative.     Objective:  Physical Exam  Constitutional: She is oriented to person, place, and time. She appears well-developed and well-nourished.  HENT:  Head: Normocephalic and atraumatic.  Mouth/Throat: Oropharynx is clear and moist.  Eyes: Pupils are equal, round, and reactive to light.  Neck: Neck supple. No JVD present. No tracheal deviation present. No thyromegaly present.  Cardiovascular: Normal rate, regular rhythm and intact distal pulses.   Respiratory: Effort normal and breath sounds normal. No stridor. No  respiratory distress. She has no wheezes.  GI: Soft. There is no tenderness. There is no guarding.  Musculoskeletal:       Right knee: She exhibits decreased range of motion, swelling and bony tenderness. She exhibits no effusion, no ecchymosis, no deformity and no erythema. Tenderness found.  Lymphadenopathy:    She has no cervical adenopathy.  Neurological: She is alert and oriented to person, place, and time.  Skin: Skin is warm and dry.  Psychiatric: She has a normal mood and affect.    Vital signs in last 24 hours: Temp:  [97.6 F (36.4 C)] 97.6 F (36.4 C) (04/30 1147) Pulse Rate:  [97] 97 (04/30 1147) Resp:  [20] 20 (04/30 1147) BP: (155)/(54) 155/54 mmHg (04/30 1147) SpO2:  [97 %] 97 % (04/30 1147) Weight:  [115.214 kg (254 lb)] 115.214 kg (254 lb) (04/30 1147)  Labs:   Estimated body mass index is 49.14 kg/(m^2) as calculated from the following:   Height as of 09/21/12: 5' (1.524 m).   Weight as of 09/21/12: 114.125 kg (251 lb 9.6 oz).   Imaging Review Plain radiographs demonstrate severe degenerative joint disease of the right knee(s). The overall alignment is neutral. The bone quality appears to be good for age and reported activity level.  Assessment/Plan:  End stage arthritis, right knee   The patient history, physical examination, clinical judgment of the provider and imaging studies are consistent with end stage degenerative joint disease of the right knee(s) and total knee arthroplasty is deemed medically necessary. The treatment options including medical management, injection therapy arthroscopy and arthroplasty were discussed at length. The risks and benefits of total knee arthroplasty were presented and reviewed. The risks due to aseptic loosening, infection, stiffness, patella tracking problems, thromboembolic complications and other imponderables were discussed. The patient acknowledged the explanation, agreed to proceed with the plan and consent was signed.  Patient is being admitted for inpatient treatment for surgery, pain control, PT, OT, prophylactic antibiotics, VTE prophylaxis, progressive ambulation and ADL's and discharge planning. The patient is planning to be discharged home with home health services.   Anastasio Auerbach Avrielle Fry   PAC  03/08/2013, 3:23 PM

## 2013-03-08 NOTE — Progress Notes (Signed)
Abnormal UA results faxed to Dr. Charlann Boxer thru [facility]

## 2013-03-10 NOTE — Progress Notes (Signed)
Phone call to [facility] - he will review UA  In EPIC

## 2013-03-11 LAB — URINE CULTURE: Colony Count: >=100000

## 2013-03-13 MED ORDER — TRANEXAMIC ACID 100 MG/ML IV SOLN
1000.0000 mg | Freq: Once | INTRAVENOUS | Status: AC
  Administered 2013-03-14: 1000 mg via INTRAVENOUS
  Filled 2013-03-13: qty 10

## 2013-03-14 ENCOUNTER — Encounter (HOSPITAL_COMMUNITY): Payer: Self-pay | Admitting: *Deleted

## 2013-03-14 ENCOUNTER — Inpatient Hospital Stay (HOSPITAL_COMMUNITY)
Admission: RE | Admit: 2013-03-14 | Discharge: 2013-03-18 | DRG: 470 | Disposition: A | Discharge status: Home Health | Payer: Medicare Other | Source: Ambulatory Visit | Attending: Orthopedic Surgery | Admitting: Orthopedic Surgery

## 2013-03-14 ENCOUNTER — Inpatient Hospital Stay (HOSPITAL_COMMUNITY): Payer: Medicare Other | Admitting: *Deleted

## 2013-03-14 ENCOUNTER — Encounter (HOSPITAL_COMMUNITY)
Admission: RE | Disposition: A | Discharge status: Home Health | Payer: Self-pay | Source: Ambulatory Visit | Attending: Orthopedic Surgery

## 2013-03-14 DIAGNOSIS — E119 Type 2 diabetes mellitus without complications: Secondary | ICD-10-CM | POA: Diagnosis present

## 2013-03-14 DIAGNOSIS — G4733 Obstructive sleep apnea (adult) (pediatric): Secondary | ICD-10-CM | POA: Diagnosis present

## 2013-03-14 DIAGNOSIS — F17200 Nicotine dependence, unspecified, uncomplicated: Secondary | ICD-10-CM | POA: Diagnosis present

## 2013-03-14 DIAGNOSIS — Z96659 Presence of unspecified artificial knee joint: Secondary | ICD-10-CM

## 2013-03-14 DIAGNOSIS — M1710 Unilateral primary osteoarthritis, unspecified knee: Principal | ICD-10-CM | POA: Diagnosis present

## 2013-03-14 DIAGNOSIS — F319 Bipolar disorder, unspecified: Secondary | ICD-10-CM | POA: Diagnosis present

## 2013-03-14 DIAGNOSIS — K219 Gastro-esophageal reflux disease without esophagitis: Secondary | ICD-10-CM | POA: Diagnosis present

## 2013-03-14 DIAGNOSIS — Z96651 Presence of right artificial knee joint: Secondary | ICD-10-CM

## 2013-03-14 DIAGNOSIS — D62 Acute posthemorrhagic anemia: Secondary | ICD-10-CM | POA: Diagnosis not present

## 2013-03-14 DIAGNOSIS — Z01812 Encounter for preprocedural laboratory examination: Secondary | ICD-10-CM

## 2013-03-14 DIAGNOSIS — D500 Iron deficiency anemia secondary to blood loss (chronic): Secondary | ICD-10-CM | POA: Diagnosis not present

## 2013-03-14 DIAGNOSIS — Z6841 Body Mass Index (BMI) 40.0 and over, adult: Secondary | ICD-10-CM

## 2013-03-14 HISTORY — PX: TOTAL KNEE ARTHROPLASTY: SHX125

## 2013-03-14 LAB — GLUCOSE, CAPILLARY
Glucose-Capillary: 169 mg/dL — ABNORMAL HIGH (ref 70–99)
Glucose-Capillary: 133 mg/dL — ABNORMAL HIGH (ref 70–99)
Glucose-Capillary: 116 mg/dL — ABNORMAL HIGH (ref 70–99)
Glucose-Capillary: 172 mg/dL — ABNORMAL HIGH (ref 70–99)

## 2013-03-14 LAB — TYPE AND SCREEN
ABO/RH(D): O POS
Antibody Screen: NEGATIVE

## 2013-03-14 SURGERY — ARTHROPLASTY, KNEE, TOTAL
Anesthesia: Spinal | Site: Knee | Laterality: Right | Wound class: Clean

## 2013-03-14 MED ORDER — OXYCODONE HCL 5 MG/5ML PO SOLN: 5.0000 mg | Freq: Once | ORAL | Status: DC | PRN

## 2013-03-14 MED ORDER — ACETAMINOPHEN 10 MG/ML IV SOLN
INTRAVENOUS | Status: DC | PRN
  Administered 2013-03-14: 1000 mg via INTRAVENOUS

## 2013-03-14 MED ORDER — HYDROCODONE-ACETAMINOPHEN 7.5-325 MG PO TABS
1.0000 | ORAL_TABLET | ORAL | Status: DC
  Administered 2013-03-14 (×3): 2 via ORAL
  Administered 2013-03-15: 1 via ORAL
  Administered 2013-03-15: 2 via ORAL
  Administered 2013-03-15 (×2): 1 via ORAL
  Administered 2013-03-15 (×2): 2 via ORAL
  Administered 2013-03-16: 1 via ORAL
  Administered 2013-03-16 (×5): 2 via ORAL
  Filled 2013-03-14: qty 2
  Filled 2013-03-14: qty 1
  Filled 2013-03-14 (×8): qty 2
  Filled 2013-03-14: qty 1
  Filled 2013-03-14 (×4): qty 2

## 2013-03-14 MED ORDER — PROPOFOL 10 MG/ML IV EMUL
INTRAVENOUS | Status: DC | PRN
  Administered 2013-03-14: 50 ug/kg/min via INTRAVENOUS

## 2013-03-14 MED ORDER — KETOROLAC TROMETHAMINE 30 MG/ML IJ SOLN
INTRAMUSCULAR | Status: DC | PRN
  Administered 2013-03-14: 1 mg

## 2013-03-14 MED ORDER — ONDANSETRON HCL 4 MG/2ML IJ SOLN: 4.0000 mg | Freq: Four times a day (QID) | INTRAMUSCULAR | Status: DC | PRN

## 2013-03-14 MED ORDER — ALUMINUM HYDROXIDE GEL 320 MG/5ML PO SUSP: 15.0000 mL | ORAL | Status: DC | PRN

## 2013-03-14 MED ORDER — POLYETHYLENE GLYCOL 3350 17 G PO PACK: 17.0000 g | PACK | Freq: Every day | ORAL | Status: DC | PRN

## 2013-03-14 MED ORDER — BUPIVACAINE LIPOSOME 1.3 % IJ SUSP
20.0000 mL | Freq: Once | INTRAMUSCULAR | Status: DC
  Filled 2013-03-14: qty 20

## 2013-03-14 MED ORDER — PHENOL 1.4 % MT LIQD: 1.0000 | OROMUCOSAL | Status: DC | PRN

## 2013-03-14 MED ORDER — CLINDAMYCIN PHOSPHATE 900 MG/50ML IV SOLN
900.0000 mg | INTRAVENOUS | Status: AC
  Administered 2013-03-14: 900 mg via INTRAVENOUS

## 2013-03-14 MED ORDER — KETAMINE HCL 10 MG/ML IJ SOLN
INTRAMUSCULAR | Status: DC | PRN
  Administered 2013-03-14: 10 mg via INTRAVENOUS

## 2013-03-14 MED ORDER — PROSIGHT PO TABS
1.0000 | ORAL_TABLET | Freq: Every day | ORAL | Status: DC
  Administered 2013-03-14 – 2013-03-18 (×5): 1 via ORAL
  Filled 2013-03-14 (×5): qty 1

## 2013-03-14 MED ORDER — BUPIVACAINE-EPINEPHRINE 0.25% -1:200000 IJ SOLN
INTRAMUSCULAR | Status: DC | PRN
  Administered 2013-03-14: 20 mL

## 2013-03-14 MED ORDER — PROMETHAZINE HCL 25 MG/ML IJ SOLN: 6.2500 mg | INTRAMUSCULAR | Status: DC | PRN

## 2013-03-14 MED ORDER — HYDROMORPHONE HCL PF 1 MG/ML IJ SOLN
0.2500 mg | INTRAMUSCULAR | Status: DC | PRN
  Administered 2013-03-14 (×2): 0.25 mg via INTRAVENOUS

## 2013-03-14 MED ORDER — OXYCODONE HCL 5 MG PO TABS: 5.0000 mg | ORAL_TABLET | Freq: Once | ORAL | Status: DC | PRN

## 2013-03-14 MED ORDER — INSULIN ASPART 100 UNIT/ML ~~LOC~~ SOLN
0.0000 [IU] | Freq: Three times a day (TID) | SUBCUTANEOUS | Status: DC
  Administered 2013-03-15: 2 [IU] via SUBCUTANEOUS
  Administered 2013-03-15: 3 [IU] via SUBCUTANEOUS
  Administered 2013-03-15 – 2013-03-16 (×2): 2 [IU] via SUBCUTANEOUS
  Administered 2013-03-16: 3 [IU] via SUBCUTANEOUS
  Administered 2013-03-17 – 2013-03-18 (×3): 2 [IU] via SUBCUTANEOUS

## 2013-03-14 MED ORDER — MEPERIDINE HCL 50 MG/ML IJ SOLN: 6.2500 mg | INTRAMUSCULAR | Status: DC | PRN

## 2013-03-14 MED ORDER — BUPIVACAINE LIPOSOME 1.3 % IJ SUSP
INTRAMUSCULAR | Status: DC | PRN
  Administered 2013-03-14: 20 mL

## 2013-03-14 MED ORDER — BUPROPION HCL ER (SR) 100 MG PO TB12
200.0000 mg | ORAL_TABLET | Freq: Two times a day (BID) | ORAL | Status: DC
  Administered 2013-03-14 – 2013-03-18 (×8): 200 mg via ORAL
  Filled 2013-03-14 (×9): qty 2

## 2013-03-14 MED ORDER — LACTATED RINGERS IV SOLN
INTRAVENOUS | Status: DC
  Administered 2013-03-14: 1000 mL via INTRAVENOUS
  Administered 2013-03-14: 11:00:00 via INTRAVENOUS

## 2013-03-14 MED ORDER — CHLORHEXIDINE GLUCONATE 4 % EX LIQD
60.0000 mL | Freq: Once | CUTANEOUS | Status: DC
  Filled 2013-03-14: qty 60

## 2013-03-14 MED ORDER — BUPIVACAINE IN DEXTROSE 0.75-8.25 % IT SOLN
INTRATHECAL | Status: DC | PRN
  Administered 2013-03-14: 2 mL via INTRATHECAL

## 2013-03-14 MED ORDER — 0.9 % SODIUM CHLORIDE (POUR BTL) OPTIME
TOPICAL | Status: DC | PRN
  Administered 2013-03-14: 1000 mL

## 2013-03-14 MED ORDER — VENLAFAXINE HCL ER 150 MG PO CP24
150.0000 mg | ORAL_CAPSULE | Freq: Every day | ORAL | Status: DC
  Administered 2013-03-14 – 2013-03-16 (×3): 150 mg via ORAL
  Filled 2013-03-14 (×5): qty 1

## 2013-03-14 MED ORDER — CLINDAMYCIN PHOSPHATE 600 MG/50ML IV SOLN
600.0000 mg | Freq: Four times a day (QID) | INTRAVENOUS | Status: AC
  Administered 2013-03-14 (×2): 600 mg via INTRAVENOUS
  Filled 2013-03-14 (×2): qty 50

## 2013-03-14 MED ORDER — OCUVITE PO TABS: 1.0000 | ORAL_TABLET | Freq: Every day | ORAL | Status: DC

## 2013-03-14 MED ORDER — LOSARTAN POTASSIUM 25 MG PO TABS
25.0000 mg | ORAL_TABLET | Freq: Every day | ORAL | Status: DC
  Administered 2013-03-14 – 2013-03-18 (×5): 25 mg via ORAL
  Filled 2013-03-14 (×6): qty 1

## 2013-03-14 MED ORDER — MENTHOL 3 MG MT LOZG: 1.0000 | LOZENGE | OROMUCOSAL | Status: DC | PRN

## 2013-03-14 MED ORDER — ONDANSETRON HCL 4 MG/2ML IJ SOLN
INTRAMUSCULAR | Status: DC | PRN
  Administered 2013-03-14: 4 mg via INTRAVENOUS

## 2013-03-14 MED ORDER — ATORVASTATIN CALCIUM 20 MG PO TABS
20.0000 mg | ORAL_TABLET | Freq: Every day | ORAL | Status: DC
  Administered 2013-03-15 – 2013-03-18 (×4): 20 mg via ORAL
  Filled 2013-03-14 (×4): qty 1

## 2013-03-14 MED ORDER — FENTANYL CITRATE 0.05 MG/ML IJ SOLN
INTRAMUSCULAR | Status: DC | PRN
  Administered 2013-03-14: 50 ug via INTRAVENOUS

## 2013-03-14 MED ORDER — ZOLPIDEM TARTRATE 5 MG PO TABS: 5.0000 mg | ORAL_TABLET | Freq: Every evening | ORAL | Status: DC | PRN

## 2013-03-14 MED ORDER — VENLAFAXINE HCL ER 75 MG PO CP24
75.0000 mg | ORAL_CAPSULE | Freq: Every day | ORAL | Status: DC
  Administered 2013-03-15 – 2013-03-18 (×4): 75 mg via ORAL
  Filled 2013-03-14 (×6): qty 1

## 2013-03-14 MED ORDER — ONDANSETRON HCL 4 MG PO TABS: 4.0000 mg | ORAL_TABLET | Freq: Four times a day (QID) | ORAL | Status: DC | PRN

## 2013-03-14 MED ORDER — METHOCARBAMOL 100 MG/ML IJ SOLN
500.0000 mg | Freq: Four times a day (QID) | INTRAVENOUS | Status: DC | PRN
  Administered 2013-03-14: 500 mg via INTRAVENOUS
  Filled 2013-03-14 (×2): qty 5

## 2013-03-14 MED ORDER — SENNA 8.6 MG PO TABS
1.0000 | ORAL_TABLET | Freq: Two times a day (BID) | ORAL | Status: DC
  Administered 2013-03-14 – 2013-03-17 (×7): 8.6 mg via ORAL
  Filled 2013-03-14 (×8): qty 1

## 2013-03-14 MED ORDER — DIPHENHYDRAMINE HCL 12.5 MG/5ML PO ELIX: 25.0000 mg | ORAL_SOLUTION | Freq: Four times a day (QID) | ORAL | Status: DC | PRN

## 2013-03-14 MED ORDER — METHOCARBAMOL 500 MG PO TABS
500.0000 mg | ORAL_TABLET | Freq: Four times a day (QID) | ORAL | Status: DC | PRN
  Administered 2013-03-15 – 2013-03-18 (×4): 500 mg via ORAL
  Filled 2013-03-14 (×4): qty 1

## 2013-03-14 MED ORDER — RIVAROXABAN 10 MG PO TABS
10.0000 mg | ORAL_TABLET | Freq: Every day | ORAL | Status: DC
  Administered 2013-03-15 – 2013-03-18 (×4): 10 mg via ORAL
  Filled 2013-03-14 (×5): qty 1

## 2013-03-14 MED ORDER — LIDOCAINE HCL (CARDIAC) 20 MG/ML IV SOLN
INTRAVENOUS | Status: DC | PRN
  Administered 2013-03-14: 50 mg via INTRAVENOUS

## 2013-03-14 MED ORDER — SODIUM CHLORIDE 0.9 % IJ SOLN
INTRAMUSCULAR | Status: DC | PRN
  Administered 2013-03-14: 19 mL via INTRAVENOUS

## 2013-03-14 MED ORDER — LACTATED RINGERS IV SOLN
INTRAVENOUS | Status: DC | PRN
  Administered 2013-03-14 (×2): via INTRAVENOUS

## 2013-03-14 MED ORDER — MIDAZOLAM HCL 5 MG/5ML IJ SOLN
INTRAMUSCULAR | Status: DC | PRN
  Administered 2013-03-14 (×2): 1 mg via INTRAVENOUS

## 2013-03-14 MED ORDER — STERILE WATER FOR IRRIGATION IR SOLN
Status: DC | PRN
  Administered 2013-03-14 (×2): 1500 mL

## 2013-03-14 MED ORDER — HYDROMORPHONE HCL PF 1 MG/ML IJ SOLN
0.2000 mg | INTRAMUSCULAR | Status: DC | PRN
  Administered 2013-03-14: 0.6 mg via INTRAVENOUS
  Administered 2013-03-14 – 2013-03-15 (×3): 0.5 mg via INTRAVENOUS
  Filled 2013-03-14 (×4): qty 1

## 2013-03-14 MED ORDER — FERROUS SULFATE 325 (65 FE) MG PO TABS
325.0000 mg | ORAL_TABLET | Freq: Three times a day (TID) | ORAL | Status: DC
  Administered 2013-03-14 – 2013-03-18 (×11): 325 mg via ORAL
  Filled 2013-03-14 (×14): qty 1

## 2013-03-14 MED ORDER — SODIUM CHLORIDE 0.9 % IV SOLN
INTRAVENOUS | Status: DC
  Administered 2013-03-14 – 2013-03-15 (×3): via INTRAVENOUS
  Filled 2013-03-14 (×13): qty 1000

## 2013-03-14 MED ORDER — BUPROPION HCL ER (SR) 100 MG PO TB12: 200.0000 mg | ORAL_TABLET | Freq: Two times a day (BID) | ORAL | Status: DC

## 2013-03-14 MED ORDER — ACETAMINOPHEN 10 MG/ML IV SOLN: 1000.0000 mg | Freq: Once | INTRAVENOUS | Status: DC | PRN

## 2013-03-14 MED ORDER — DOCUSATE SODIUM 100 MG PO CAPS
100.0000 mg | ORAL_CAPSULE | Freq: Two times a day (BID) | ORAL | Status: DC
  Administered 2013-03-14 – 2013-03-18 (×8): 100 mg via ORAL

## 2013-03-14 SURGICAL SUPPLY — 58 items
BAG ZIPLOCK 12X15 (MISCELLANEOUS) ×2 IMPLANT
BANDAGE ELASTIC 4 VELCRO ST LF (GAUZE/BANDAGES/DRESSINGS) ×2 IMPLANT
BANDAGE ELASTIC 6 VELCRO ST LF (GAUZE/BANDAGES/DRESSINGS) ×2 IMPLANT
BANDAGE ESMARK 6X9 LF (GAUZE/BANDAGES/DRESSINGS) ×1 IMPLANT
BLADE SAW SGTL 13.0X1.19X90.0M (BLADE) ×2 IMPLANT
BNDG COHESIVE 4X5 TAN STRL (GAUZE/BANDAGES/DRESSINGS) ×2 IMPLANT
BNDG ESMARK 6X9 LF (GAUZE/BANDAGES/DRESSINGS) ×2
BONE CEMENT GENTAMICIN (Cement) ×4 IMPLANT
BOWL SMART MIX CTS (DISPOSABLE) ×2 IMPLANT
CEMENT BONE GENTAMICIN 40 (Cement) ×2 IMPLANT
CLOTH BEACON ORANGE TIMEOUT ST (SAFETY) ×2 IMPLANT
CUFF TOURN SGL QUICK 34 (TOURNIQUET CUFF) ×1
CUFF TRNQT CYL 34X4X40X1 (TOURNIQUET CUFF) ×1 IMPLANT
DECANTER SPIKE VIAL GLASS SM (MISCELLANEOUS) ×4 IMPLANT
DERMABOND ADVANCED (GAUZE/BANDAGES/DRESSINGS) ×1
DERMABOND ADVANCED .7 DNX12 (GAUZE/BANDAGES/DRESSINGS) ×1 IMPLANT
DRAPE EXTREMITY T 121X128X90 (DRAPE) ×2 IMPLANT
DRAPE POUCH INSTRU U-SHP 10X18 (DRAPES) ×2 IMPLANT
DRAPE U-SHAPE 47X51 STRL (DRAPES) ×2 IMPLANT
DRSG AQUACEL AG ADV 3.5X10 (GAUZE/BANDAGES/DRESSINGS) ×2 IMPLANT
DRSG TEGADERM 4X4.75 (GAUZE/BANDAGES/DRESSINGS) ×2 IMPLANT
DURAPREP 26ML APPLICATOR (WOUND CARE) ×2 IMPLANT
ELECT REM PT RETURN 9FT ADLT (ELECTROSURGICAL) ×2
ELECTRODE REM PT RTRN 9FT ADLT (ELECTROSURGICAL) ×1 IMPLANT
EVACUATOR 1/8 PVC DRAIN (DRAIN) ×2 IMPLANT
FACESHIELD LNG OPTICON STERILE (SAFETY) ×10 IMPLANT
GAUZE SPONGE 2X2 8PLY STRL LF (GAUZE/BANDAGES/DRESSINGS) ×1 IMPLANT
GLOVE BIOGEL PI IND STRL 7.5 (GLOVE) ×1 IMPLANT
GLOVE BIOGEL PI IND STRL 8 (GLOVE) ×1 IMPLANT
GLOVE BIOGEL PI INDICATOR 7.5 (GLOVE) ×1
GLOVE BIOGEL PI INDICATOR 8 (GLOVE) ×1
GLOVE ECLIPSE 8.0 STRL XLNG CF (GLOVE) ×2 IMPLANT
GLOVE INDICATOR 6.5 STRL GRN (GLOVE) ×8 IMPLANT
GLOVE ORTHO TXT STRL SZ7.5 (GLOVE) ×4 IMPLANT
GOWN BRE IMP PREV XXLGXLNG (GOWN DISPOSABLE) ×2 IMPLANT
GOWN STRL NON-REIN LRG LVL3 (GOWN DISPOSABLE) ×2 IMPLANT
HANDPIECE INTERPULSE COAX TIP (DISPOSABLE) ×1
KIT BASIN OR (CUSTOM PROCEDURE TRAY) ×2 IMPLANT
MANIFOLD NEPTUNE II (INSTRUMENTS) ×2 IMPLANT
NDL SAFETY ECLIPSE 18X1.5 (NEEDLE) ×1 IMPLANT
NEEDLE HYPO 18GX1.5 SHARP (NEEDLE) ×1
NS IRRIG 1000ML POUR BTL (IV SOLUTION) ×4 IMPLANT
PACK TOTAL JOINT (CUSTOM PROCEDURE TRAY) ×2 IMPLANT
POSITIONER SURGICAL ARM (MISCELLANEOUS) ×2 IMPLANT
SET HNDPC FAN SPRY TIP SCT (DISPOSABLE) ×1 IMPLANT
SET PAD KNEE POSITIONER (MISCELLANEOUS) ×2 IMPLANT
SPONGE GAUZE 2X2 STER 10/PKG (GAUZE/BANDAGES/DRESSINGS) ×1
SUCTION FRAZIER 12FR DISP (SUCTIONS) ×2 IMPLANT
SUT MNCRL AB 4-0 PS2 18 (SUTURE) ×2 IMPLANT
SUT VIC AB 1 CT1 36 (SUTURE) ×2 IMPLANT
SUT VIC AB 2-0 CT1 27 (SUTURE) ×3
SUT VIC AB 2-0 CT1 TAPERPNT 27 (SUTURE) ×3 IMPLANT
SUT VLOC 180 0 24IN GS25 (SUTURE) ×2 IMPLANT
SYR 50ML LL SCALE MARK (SYRINGE) ×2 IMPLANT
TOWEL OR 17X26 10 PK STRL BLUE (TOWEL DISPOSABLE) ×4 IMPLANT
TRAY FOLEY CATH 14FRSI W/METER (CATHETERS) ×2 IMPLANT
WATER STERILE IRR 1500ML POUR (IV SOLUTION) ×2 IMPLANT
WRAP KNEE MAXI GEL POST OP (GAUZE/BANDAGES/DRESSINGS) ×2 IMPLANT

## 2013-03-14 NOTE — Anesthesia Procedure Notes (Signed)
Spinal  Patient location during procedure: OR Start time: 03/14/2013 8:32 AM End time: 03/14/2013 8:41 AM Staffing Anesthesiologist: Lewie Loron R Performed by: anesthesiologist  Preanesthetic Checklist Completed: patient identified, site marked, surgical consent, pre-op evaluation, timeout performed, IV checked, risks and benefits discussed and monitors and equipment checked Spinal Block Patient position: sitting Prep: Betadine Patient monitoring: heart rate, continuous pulse ox and blood pressure Approach: midline Location: L2-3 Injection technique: single-shot Needle Needle type: Sprotte  Needle gauge: 24 G Needle length: 9 cm Assessment Sensory level: T8 Additional Notes Expiration date of kit checked and confirmed. Patient tolerated procedure well, without complications.

## 2013-03-14 NOTE — Progress Notes (Signed)
RT Note: Patient states she wears CPAP at home. RT brought our equipment to patients room and she has her home mask and tubing to go with our equipment. Rt will set up patient on CPAP tonight per her home settings or per RT protocol if patient does not know her home settings. Rt to continue to monitor.

## 2013-03-14 NOTE — Transfer of Care (Signed)
Immediate Anesthesia Transfer of Care Note  Patient: Chelsea [name]  Procedure(s) Performed: Procedure(s): RIGHT TOTAL KNEE ARTHROPLASTY (Right)  Patient Location: PACU  Anesthesia Type:Spinal  Level of Consciousness: awake, alert , oriented and patient cooperative  Airway & Oxygen Therapy: Patient Spontanous Breathing and Patient connected to face mask oxygen  Post-op Assessment: Report given to PACU RN and Post -op Vital signs reviewed and stable  Post vital signs: Reviewed and stable  Complications: No apparent anesthesia complications

## 2013-03-14 NOTE — Interval H&P Note (Signed)
History and Physical Interval Note:  03/14/2013 6:51 AM  Chelsea [name]  has presented today for surgery, with the diagnosis of RIGHT KNEE OA  The various methods of treatment have been discussed with the patient and family. After consideration of risks, benefits and other options for treatment, the patient has consented to  Procedure(s): RIGHT TOTAL KNEE ARTHROPLASTY (Right) as a surgical intervention .  The patient's history has been reviewed, patient examined, no change in status, stable for surgery.  I have reviewed the patient's chart and labs.  Questions were answered to the patient's satisfaction.     Shelda Pal

## 2013-03-14 NOTE — Anesthesia Preprocedure Evaluation (Addendum)
Anesthesia Evaluation  Patient identified by MRN, date of birth, ID band Patient awake    Reviewed: Allergy & Precautions, H&P , NPO status , Patient's Chart, lab work & pertinent test results  History of Anesthesia Complications Negative for: history of anesthetic complications  Airway Mallampati: II TM Distance: >3 FB Neck ROM: Full    Dental  (+) Dental Advisory Given and Teeth Intact   Pulmonary sleep apnea and Continuous Positive Airway Pressure Ventilation , former smoker,  breath sounds clear to auscultation        Cardiovascular negative cardio ROS  Rhythm:Regular Rate:Normal     Neuro/Psych  Headaches, PSYCHIATRIC DISORDERS Depression negative neurological ROS     GI/Hepatic negative GI ROS, Neg liver ROS, GERD-  ,  Endo/Other  diabetes, Type obesity  Renal/GU negative Renal ROS     Musculoskeletal  (+) Arthritis -, Osteoarthritis,    Abdominal (+) + obese,   Peds  Hematology negative hematology ROS (+)   Anesthesia Other Findings   Reproductive/Obstetrics negative OB ROS                          Anesthesia Physical Anesthesia Plan  ASA: III  Anesthesia Plan: Spinal   Post-op Pain Management:    Induction:   Airway Management Planned:   Additional Equipment:   Intra-op Plan:   Post-operative Plan:   Informed Consent: I have reviewed the patients History and Physical, chart, labs and discussed the procedure including the risks, benefits and alternatives for the proposed anesthesia with the patient or authorized representative who has indicated his/her understanding and acceptance.   Dental advisory given  Plan Discussed with: CRNA  Anesthesia Plan Comments:        Anesthesia Quick Evaluation

## 2013-03-14 NOTE — Anesthesia Postprocedure Evaluation (Signed)
Anesthesia Post Note  Patient: Chelsea [name]  Procedure(s) Performed: Procedure(s) (LRB): RIGHT TOTAL KNEE ARTHROPLASTY (Right)  Anesthesia type: Spinal  Patient location: PACU  Post pain: Pain level controlled  Post assessment: Post-op Vital signs reviewed  Last Vitals: BP 106/62  Pulse 81  Temp(Src) 36.5 C (Oral)  Resp 16  Wt 254 lb (115.214 kg)  BMI 49.61 kg/m2  SpO2 100%  Post vital signs: Reviewed  Level of consciousness: sedated  Complications: No apparent anesthesia complications

## 2013-03-14 NOTE — Op Note (Signed)
NAME:  Chelsea [name]                      MEDICAL RECORD NO.:  [id number]                             FACILITY:  [facility]      PHYSICIAN:  Madlyn Frankel. Charlann Boxer, M.D.  DATE OF BIRTH:  May 01, 1946      DATE OF PROCEDURE:  03/14/2013                                     OPERATIVE REPORT         PREOPERATIVE DIAGNOSIS:  Right knee osteoarthritis.      POSTOPERATIVE DIAGNOSIS:  Right knee osteoarthritis.      FINDINGS:  The patient was noted to have complete loss of cartilage and   bone-on-bone arthritis with associated osteophytes in all three compartments of   the knee with a significant synovitis and associated effusion.      PROCEDURE:  Right total knee replacement.      COMPONENTS USED:  DePuy rotating platform posterior stabilized knee   system, a size 2.5 femur, 2.5 tibia, 10 mm PS insert, and 35 patellar   button.      SURGEON:  Madlyn Frankel. Charlann Boxer, M.D.      ASSISTANT:  Leilani Able, PA-C.      ANESTHESIA:  Spinal.      SPECIMENS:  None.      COMPLICATION:  None.      DRAINS:  One Hemovac.  EBL: <100cc      TOURNIQUET TIME:   Total Tourniquet Time Documented: Thigh (Right) - 35 minutes Total: Thigh (Right) - 35 minutes  .      The patient was stable to the recovery room.      INDICATION FOR PROCEDURE:  Chelsea [name] is a 67 y.o. female patient of   mine.  The patient had been seen, evaluated, and treated conservatively in the   office with medication, activity modification, and injections.  The patient had   radiographic changes of bone-on-bone arthritis with endplate sclerosis and osteophytes noted.      The patient failed conservative measures including medication, injections, and activity modification, and at this point was ready for more definitive measures.   Based on the radiographic changes and failed conservative measures, the patient   decided to proceed with total knee replacement.  Risks of infection,   DVT, component failure, need for revision surgery, postop  course, and   expectations were all   discussed and reviewed.  Consent was obtained for benefit of pain   relief.      PROCEDURE IN DETAIL:  The patient was brought to the operative theater.   Once adequate anesthesia, preoperative antibiotics, 900mg  of Clindamycin administered, the patient was positioned supine with the right thigh tourniquet placed.  The  right lower extremity was prepped and draped in sterile fashion.  A time-   out was performed identifying the patient, planned procedure, and   extremity.      The right lower extremity was placed in the [facility] leg holder.  The leg was   exsanguinated, tourniquet elevated to 250 mmHg.  A midline incision was   made followed by median parapatellar arthrotomy.  Following initial   exposure, attention was first directed to  the patella.  Precut   measurement was noted to be 21 mm.  I resected down to 14 mm and used a   35 patellar button to restore patellar height as well as cover the cut   surface.      The lug holes were drilled and a metal shim was placed to protect the   patella from retractors and saw blades.      At this point, attention was now directed to the femur.  The femoral   canal was opened with a drill, irrigated to try to prevent fat emboli.  An   intramedullary rod was passed at 5 degrees valgus, 10 mm of bone was   resected off the distal femur.  Following this resection, the tibia was   subluxated anteriorly.  Using the extramedullary guide, 10 mm of bone was resected off   the proximal lateral tibia.  We confirmed the gap would be   stable medially and laterally with a 10 mm insert as well as confirmed   the cut was perpendicular in the coronal plane, checking with an alignment rod.      Once this was done, I sized the femur to be a size 2.5 in the anterior-   posterior dimension, chose a standard component based on medial and   lateral dimension.  The size 2.5 rotation block was then pinned in   position  anterior referenced using the C-clamp to set rotation.  The   anterior, posterior, and  chamfer cuts were made without difficulty nor   notching making certain that I was along the anterior cortex to help   with flexion gap stability.      The final box cut was made off the lateral aspect of distal femur.      At this point, the tibia was sized to be a size 2.5, the size 2.5 tray was   then pinned in position through the medial third of the tubercle,   drilled, and keel punched.  Trial reduction was now carried with a 2.5 femur,  2.5 tibia, a 10 mm insert, and the 35 patella botton.  The knee was brought to   extension, full extension with good flexion stability with the patella   tracking through the trochlea without application of pressure.  Given   all these findings, the trial components removed.  Final components were   opened and cement was mixed.  The knee was irrigated with normal saline   solution and pulse lavage.  The synovial lining was   then injected with 0.25% Marcaine with epinephrine and 1 cc of Toradol,   total of 61 cc.      The knee was irrigated.  Final implants were then cemented onto clean and   dried cut surfaces of bone with the knee brought to extension with a 10 mm trial insert.      Once the cement had fully cured, the excess cement was removed   throughout the knee.  I confirmed I was satisfied with the range of   motion and stability, and the final 10 mm PS insert was chosen.  It was   placed into the knee.      The tourniquet had been let down at 35 minutes.  No significant   hemostasis required.  The medium Hemovac drain was placed deep.  The   extensor mechanism was then reapproximated using #1 Vicryl with the knee   in flexion.  The   remaining wound  was closed with 2-0 Vicryl and running 4-0 Monocryl.   The knee was cleaned, dried, dressed sterilely using Dermabond and   Aquacel dressing.  Drain site dressed separately.  The patient was then    brought to recovery room in stable condition, tolerating the procedure   well.   Please note that Physician Assistant, Leilani Able, was present for the entirety of the case, and was utilized for pre-operative positioning, peri-operative retractor management, general facilitation of the procedure.  He was also utilized for primary wound closure at the end of the case.              Madlyn Frankel Charlann Boxer, M.D.

## 2013-03-14 NOTE — Progress Notes (Signed)
Utilization review completed.  

## 2013-03-15 ENCOUNTER — Encounter (HOSPITAL_COMMUNITY): Payer: Self-pay | Admitting: Orthopedic Surgery

## 2013-03-15 DIAGNOSIS — D500 Iron deficiency anemia secondary to blood loss (chronic): Secondary | ICD-10-CM | POA: Diagnosis not present

## 2013-03-15 LAB — GLUCOSE, CAPILLARY
Glucose-Capillary: 164 mg/dL — ABNORMAL HIGH (ref 70–99)
Glucose-Capillary: 146 mg/dL — ABNORMAL HIGH (ref 70–99)
Glucose-Capillary: 148 mg/dL — ABNORMAL HIGH (ref 70–99)
Glucose-Capillary: 156 mg/dL — ABNORMAL HIGH (ref 70–99)

## 2013-03-15 LAB — CBC
HCT: 36.3 % (ref 36.0–46.0)
Hemoglobin: 11.9 g/dL — ABNORMAL LOW (ref 12.0–15.0)
MCH: 28.1 pg (ref 26.0–34.0)
MCHC: 32.8 g/dL (ref 30.0–36.0)
MCV: 85.6 fL (ref 78.0–100.0)
Platelets: 233 10*3/uL (ref 150–400)
RBC: 4.24 MIL/uL (ref 3.87–5.11)
RDW: 13.4 % (ref 11.5–15.5)
WBC: 10.6 10*3/uL — ABNORMAL HIGH (ref 4.0–10.5)

## 2013-03-15 LAB — BASIC METABOLIC PANEL
BUN: 14 mg/dL (ref 6–23)
CO2: 25 mEq/L (ref 19–32)
Calcium: 8.9 mg/dL (ref 8.4–10.5)
Chloride: 100 mEq/L (ref 96–112)
Creatinine, Ser: 1.05 mg/dL (ref 0.50–1.10)
GFR calc Af Amer: 63 mL/min — ABNORMAL LOW (ref >90)
GFR calc non Af Amer: 54 mL/min — ABNORMAL LOW (ref >90)
Glucose, Bld: 187 mg/dL — ABNORMAL HIGH (ref 70–99)
Potassium: 4.1 mEq/L (ref 3.5–5.1)
Sodium: 134 mEq/L — ABNORMAL LOW (ref 135–145)

## 2013-03-15 NOTE — Progress Notes (Signed)
Clinical Social Work Department BRIEF PSYCHOSOCIAL ASSESSMENT 03/15/2013  Patient:  Chelsea [name],Chelsea [name]     Account Number:  [id number]     Admit date:  03/14/2013  Clinical Social Worker:  Candie Chroman  Date/Time:  03/15/2013 04:34 PM  Referred by:  RN  Date Referred:  03/15/2013 Referred for  SNF Placement   Other Referral:   Interview type:  Patient Other interview type:    PSYCHOSOCIAL DATA Living Status:  SIGNIFICANT OTHER Admitted from facility:   Level of care:   Primary support name:  Chelsea [name] Primary support relationship to patient:  FRIEND Degree of support available:   Limited support due to health issues.    CURRENT CONCERNS Current Concerns  Post-Acute Placement   Other Concerns:    SOCIAL WORK ASSESSMENT / PLAN Pt is [name] 67 yr old female living at home prior to hospitalization. CSW met with pt to assist with d/c planning. Pt would like to d/c home with [facility] but is progressing slowly with PT. Significant other would like pt to [facility] following hospital d/c. Pt is willing to consider this option. CSW has initiated SNF search and will meet again with pt in the am to continue assisting with d/c planning.   Assessment/plan status:  Psychosocial Support/Ongoing Assessment of Needs Other assessment/ plan:   Home vs SNF   Information/referral to community resources:   SNF list provided and insurance coverage reviewed.    PATIENT'S/FAMILY'S RESPONSE TO PLAN OF CARE: Pt would like to have rehab at home but will consider [facility].   Chelsea Razor LCSW [phone]

## 2013-03-15 NOTE — Care Management Note (Addendum)
    Page 1 of 2   03/18/2013     8:21:23 PM   CARE MANAGEMENT NOTE 03/18/2013  Patient:  Chelsea [name],Chelsea [name]   Account Number:  [id number]  Date Initiated:  03/15/2013  Documentation initiated by:  Colleen Can  Subjective/Objective Assessment:   DX RT KNEE OSTEOARTHRITIS; TOTAL KNEE REPLACEMNT    Per-arranged with [facility] Nor[facility] prior to admission. services will start day after pt is discharged.     Action/Plan:   CM SPOKE WITH PATIENT. Plans are for patient to return to her home in [place] where boyfriend and sister will be caregivers. She already has RW and 3n1 has been delivered to her room. Gen[facility]lander will provide HHpt services.   Anticipated DC Date:  03/18/2013   Anticipated DC Plan:  HOME W HOME HEALTH SERVICES  In-house referral  Clinical Social Worker      DC Planning Services  CM consult      [facility] Choice  HOME HEALTH  DURABLE MEDICAL EQUIPMENT   Choice offered to / List presented to:  C-1 Patient   DME arranged  3-N-1  Levan Hurst      DME agency  [facility]     HH arranged  HH-2 PT      [facility] agency  [facility]   Status of service:  Completed, signed off Medicare Important Message given?  NA - LOS <3 / Initial given by admissions (If response is "NO", the following Medicare IM given date fields will be blank) Date Medicare IM given:   Date Additional Medicare IM given:    Discharge Disposition:  HOME W HOME HEALTH SERVICES  Per UR Regulation:    If discussed at Long Length of Stay Meetings, dates discussed:    Comments:  03/18/13 Ayn Domangue RN,BSN NCM WEEKEND [phone] [address] ON SITE WARE OF D/C HOME W/HHPT ORDER.DME-3N1,RW ALREADY DELIVERED.  03/17/2013 Colleen Can BSN RN CCM [phone] Per progress notes pt having hallucinations and confusion. Cm will follow for needs.

## 2013-03-15 NOTE — Progress Notes (Signed)
OT Cancellation Note  Patient Details Name: Chelsea [name] MRN: [id number] DOB: [birth year]   Cancelled Treatment:    Reason Eval/Treat Not Completed: Fatigue/lethargy limiting ability to participate;Medical issues which prohibited therapy.  First attempt - pt working with PT; on second attempt, pt with c/o headache after working with PT.  Will re-attempt  Boykin Reaper [phone] 03/15/2013, 12:45 PM

## 2013-03-15 NOTE — Progress Notes (Signed)
Patient currently without c/o SOB. SATS 98% 2l [place]

## 2013-03-15 NOTE — Progress Notes (Signed)
Patient with c/o of difficulty breathing to NT while assisting with bed position.   Lancie RN into room per NT request. VSS, 02 2l [address] reapplied. Resp therapy paged per [facility] RN request. This RN into pt's room when notified of situation. No rhales, rhonchi or wheezes noted.  02 sats 98% on 2l [place].

## 2013-03-15 NOTE — Evaluation (Signed)
Physical Therapy Evaluation Patient Details Name: Chelsea [name] MRN: [id number] DOB: 04/14/1946 Today's Date: 03/15/2013 Time: 1030-1057 PT Time Calculation (min): 27 min  PT Assessment / Plan / Recommendation Clinical Impression  Pt s/p R TKR presents with decreased R LE strength/ROM, post op pain and post op dizziness limiting functional mobility    PT Assessment  Patient needs continued PT services    Follow Up Recommendations  Home health PT    Does the patient have the potential to tolerate intense rehabilitation      Barriers to Discharge None      Equipment Recommendations  Rolling walker with 5" wheels    Recommendations for Other Services OT consult   Frequency 7X/week    Precautions / Restrictions Precautions Precautions: Knee;Fall Restrictions Weight Bearing Restrictions: No RLE Weight Bearing: Weight bearing as tolerated   Pertinent Vitals/Pain 7/10; premed, cold packs provided; BP with dizziness 168/84 - RN aware and assessing pt      Mobility  Bed Mobility Bed Mobility: Supine to Sit;Sit to Supine Supine to Sit: 4: Min assist;3: Mod assist;With rails Sit to Supine: 1: +2 Total assist Sit to Supine: Patient Percentage: 20% Details for Bed Mobility Assistance: cues for sequencing.  Pt returned to bed +2 with c/o increasing dizziness Transfers Transfers: Sit to Stand;Stand to Sit Sit to Stand: 1: +2 Total assist;With upper extremity assist;From bed Sit to Stand: Patient Percentage: 70% Stand to Sit: With upper extremity assist;To bed;1: +2 Total assist Stand to Sit: Patient Percentage: 70% Details for Transfer Assistance: cues for LE management in use of UEs to self assist Ambulation/Gait Ambulation/Gait Assistance:  (Amb not attempted 2* c/o dizziness)    Exercises Total Joint Exercises Ankle Circles/Pumps: AROM;15 reps;Supine;Both Quad Sets: AROM;10 reps;Supine;Both Heel Slides: AAROM;10 reps;Supine;Right Hip ABduction/ADduction:  AROM;AAROM;Right;10 reps;Supine   PT Diagnosis: Difficulty walking  PT Problem List: Decreased strength;Decreased range of motion;Decreased activity tolerance;Decreased mobility;Pain;Obesity;Decreased knowledge of use of DME;Decreased knowledge of precautions PT Treatment Interventions: DME instruction;Gait training;Stair training;Functional mobility training;Therapeutic activities;Therapeutic exercise;Patient/family education   PT Goals Acute Rehab PT Goals PT Goal Formulation: With patient Time For Goal Achievement: 03/22/13 Potential to Achieve Goals: Good Pt will go Supine/Side to Sit: with supervision PT Goal: Supine/Side to Sit - Progress: Goal set today Pt will go Sit to Supine/Side: with supervision PT Goal: Sit to Supine/Side - Progress: Goal set today Pt will go Sit to Stand: with supervision PT Goal: Sit to Stand - Progress: Goal set today Pt will go Stand to Sit: with supervision PT Goal: Stand to Sit - Progress: Goal set today Pt will Ambulate: 51 - 150 feet;with supervision;with rolling walker PT Goal: Ambulate - Progress: Goal set today Pt will Go Up / Down Stairs: 3-5 stairs;with min assist;with least restrictive assistive device PT Goal: Up/Down Stairs - Progress: Goal set today  Visit Information  Last PT Received On: 03/15/13 Assistance Needed: +2    Subjective Data  Subjective: C/o dizziness with standing and Headache after return to bed Patient Stated Goal: Resume previous lifestyle with decreased pain   Prior Functioning  Home Living Lives With: Significant other Available Help at Discharge: Family Type of Home: House Home Access: Stairs to enter Secretary/administrator of Steps: 3 Entrance Stairs-Rails: Left Home Layout: One level Home Adaptive Equipment: Walker - rolling Prior Function Level of Independence: Independent with assistive device(s) Able to Take Stairs?: Yes Driving: Yes Vocation: Retired Musician: No difficulties     Copywriter, advertising Arousal/Alertness: Awake/alert Behavior During Therapy: [facility]  for tasks assessed/performed Overall Cognitive Status: Within Functional Limits for tasks assessed    Extremity/Trunk Assessment Right Upper Extremity Assessment RUE ROM/Strength/Tone: [facility] for tasks assessed Left Upper Extremity Assessment LUE ROM/Strength/Tone: WFL for tasks assessed Right Lower Extremity Assessment RLE ROM/Strength/Tone: Deficits RLE ROM/Strength/Tone Deficits: Quads 3/5 with pt performing IND SLR - AAROM at knee -12 - 40 Left Lower Extremity Assessment LLE ROM/Strength/Tone: WFL for tasks assessed   Balance    End of Session PT - End of Session Equipment Utilized During Treatment: Gait belt Activity Tolerance: Other (comment) (ltd by dizziness ) Patient left: in bed;with call bell/phone within reach Nurse Communication: Mobility status;Other (comment) (and dizziness)  GP     Briarrose Shor 03/15/2013, 1:02 PM

## 2013-03-15 NOTE — Progress Notes (Signed)
Physical Therapy Treatment Patient Details Name: Chelsea [name] MRN: [id number] DOB: [birth year] Today's Date: 03/15/2013 Time: 4098-1191 PT Time Calculation (min): 17 min  PT Assessment / Plan / Recommendation Comments on Treatment Session  Pt initially to attempt amb to bathroom but on standing states, "I just can't make that".  Comode seat brought to bedside    Follow Up Recommendations  Home health PT     Does the patient have the potential to tolerate intense rehabilitation     Barriers to Discharge        Equipment Recommendations  Rolling walker with 5" wheels    Recommendations for Other Services OT consult  Frequency 7X/week   Plan Discharge plan remains appropriate    Precautions / Restrictions Precautions Precautions: Knee;Fall Restrictions Weight Bearing Restrictions: No RLE Weight Bearing: Weight bearing as tolerated   Pertinent Vitals/Pain 5/10    Mobility  Bed Mobility Bed Mobility: Supine to Sit;Sit to Supine Supine to Sit: 4: Min assist;3: Mod assist;With rails Sit to Supine: 3: Mod assist;4: Min assist Details for Bed Mobility Assistance: cues for sequencing and use of L LE to self assist Transfers Transfers: Sit to Stand;Stand to Sit Sit to Stand: 1: +2 Total assist;With upper extremity assist;From bed Sit to Stand: Patient Percentage: 70% Stand to Sit: With upper extremity assist;To bed;1: +2 Total assist Stand to Sit: Patient Percentage: 70% Details for Transfer Assistance: cues for LE management and use of UEs to self assist Ambulation/Gait Ambulation/Gait Assistance: 1: +2 Total assist Ambulation/Gait: Patient Percentage: 70% Ambulation Distance (Feet): 4 Feet (and 3') Assistive device: Rolling walker Ambulation/Gait Assistance Details: cues for posture, sequence, position from RW, stride length and to SLOW DOWN Gait Pattern: Step-to pattern;Decreased step length - right;Decreased step length - left;Antalgic;Decreased stance time - right     Exercises     PT Diagnosis:    PT Problem List:   PT Treatment Interventions:     PT Goals Acute Rehab PT Goals PT Goal Formulation: With patient Time For Goal Achievement: 03/22/13 Potential to Achieve Goals: Good Pt will go Supine/Side to Sit: with supervision PT Goal: Supine/Side to Sit - Progress: Goal set today Pt will go Sit to Supine/Side: with supervision PT Goal: Sit to Supine/Side - Progress: Goal set today Pt will go Sit to Stand: with supervision PT Goal: Sit to Stand - Progress: Goal set today Pt will go Stand to Sit: with supervision PT Goal: Stand to Sit - Progress: Goal set today Pt will Ambulate: 51 - 150 feet;with supervision;with rolling walker PT Goal: Ambulate - Progress: Goal set today Pt will Go Up / Down Stairs: 3-5 stairs;with min assist;with least restrictive assistive device PT Goal: Up/Down Stairs - Progress: Goal set today  Visit Information  Last PT Received On: 03/15/13 Assistance Needed: +2    Subjective Data  Subjective: I'm doing better but still see a few black spots.  I need to use the bathroom Patient Stated Goal: Resume previous lifestyle with decreased pain   Cognition  Cognition Arousal/Alertness: Awake/alert Behavior During Therapy: WFL for tasks assessed/performed Overall Cognitive Status: Within Functional Limits for tasks assessed    Balance     End of Session     GP     Amarise Lillo 03/15/2013, 4:53 PM

## 2013-03-15 NOTE — Progress Notes (Signed)
Clinical Social Work Department CLINICAL SOCIAL WORK PLACEMENT NOTE 03/15/2013  Patient:  [name],Chelsea A  Account Number:  [id number] Admit date:  03/14/2013  Clinical Social Worker:  Cori Razor, LCSW  Date/time:  03/15/2013 04:46 PM  Clinical Social Work is seeking post-discharge placement for this patient at the following level of care:   SKILLED NURSING   (*CSW will update this form in Epic as items are completed)   03/15/2013  Patient/family provided with [facility] Department of Clinical Social Work's list of facilities offering this level of care within the geographic area requested by the patient (or if unable, by the patient's family).  03/15/2013  Patient/family informed of their freedom to choose among providers that offer the needed level of care, that participate in Medicare, Medicaid or managed care program needed by the patient, have an available bed and are willing to accept the patient.    Patient/family informed of [facility]' ownership interest in [facility], as well as of the fact that they are under no obligation to receive care at this facility.  PASARR submitted to EDS on 03/15/2013 PASARR number received from [employer] on   FL2 transmitted to all facilities in geographic area requested by pt/family on  03/15/2013 FL2 transmitted to all facilities within larger geographic area on   Patient informed that his/her managed care company has contracts with or will negotiate with  certain facilities, including the following:     Patient/family informed of bed offers received:   Patient chooses bed at  Physician recommends and patient chooses bed at    Patient to be transferred to  on   Patient to be transferred to facility by   The following physician request were entered in Epic:   Additional Comments:  Cori Razor LCSW [phone]

## 2013-03-15 NOTE — Progress Notes (Signed)
   Subjective: 1 Day Post-Op Procedure(s) (LRB): RIGHT TOTAL KNEE ARTHROPLASTY (Right)   Patient reports pain as mild, Pain well controlled. No events throughout the night.   Objective:   VITALS:   Filed Vitals:   03/15/13 0626  BP: 124/85  Pulse: 86  Temp: 98.5 F (36.9 C)  Resp: 14    Neurovascular intact Dorsiflexion/Plantar flexion intact Incision: dressing C/D/I No cellulitis present Compartment soft  LABS  Recent Labs  03/15/13 0447  HGB 11.9*  HCT 36.3  WBC 10.6*  PLT 233     Recent Labs  03/15/13 0447  NA 134*  K 4.1  BUN 14  CREATININE 1.05  GLUCOSE 187*     Assessment/Plan: 1 Day Post-Op Procedure(s) (LRB): RIGHT TOTAL KNEE ARTHROPLASTY (Right) HV drain d/c'ed Foley cath d/c'ed Advance diet Up with therapy D/C IV fluids Discharge home with home health eventually, possibly tomorrow  Expected ABLA  Treated with iron and will observe  Morbid Obesity (BMI >40)  Estimated body mass index is 46.87 kg/(m^2) as calculated from the following:   Height as of this encounter: 5' (1.524 m).   Weight as of this encounter: 108.863 kg (240 lb). Patient also counseled that weight may inhibit the healing process Patient counseled that losing weight will help with future health issues      Anastasio Auerbach. Quenten Nawaz   PAC  03/15/2013, 8:37 AM

## 2013-03-16 LAB — BASIC METABOLIC PANEL
BUN: 10 mg/dL (ref 6–23)
CO2: 25 mEq/L (ref 19–32)
Calcium: 10 mg/dL (ref 8.4–10.5)
Chloride: 99 mEq/L (ref 96–112)
Creatinine, Ser: 1 mg/dL (ref 0.50–1.10)
GFR calc Af Amer: 67 mL/min — ABNORMAL LOW (ref >90)
GFR calc non Af Amer: 57 mL/min — ABNORMAL LOW (ref >90)
Glucose, Bld: 144 mg/dL — ABNORMAL HIGH (ref 70–99)
Potassium: 3.8 mEq/L (ref 3.5–5.1)
Sodium: 135 mEq/L (ref 135–145)

## 2013-03-16 LAB — CBC
HCT: 39.1 % (ref 36.0–46.0)
Hemoglobin: 12.9 g/dL (ref 12.0–15.0)
MCH: 28 pg (ref 26.0–34.0)
MCHC: 33 g/dL (ref 30.0–36.0)
MCV: 84.8 fL (ref 78.0–100.0)
Platelets: 294 10*3/uL (ref 150–400)
RBC: 4.61 MIL/uL (ref 3.87–5.11)
RDW: 13.3 % (ref 11.5–15.5)
WBC: 14.4 10*3/uL — ABNORMAL HIGH (ref 4.0–10.5)

## 2013-03-16 LAB — GLUCOSE, CAPILLARY
Glucose-Capillary: 155 mg/dL — ABNORMAL HIGH (ref 70–99)
Glucose-Capillary: 130 mg/dL — ABNORMAL HIGH (ref 70–99)
Glucose-Capillary: 111 mg/dL — ABNORMAL HIGH (ref 70–99)
Glucose-Capillary: 118 mg/dL — ABNORMAL HIGH (ref 70–99)

## 2013-03-16 NOTE — Progress Notes (Signed)
Pt placed on CPAP via nasal mask with 9 CMH20 per home settings, pt tolerating well at this time, RT to monitor and assess as needed.

## 2013-03-16 NOTE — Evaluation (Signed)
Occupational Therapy Evaluation Patient Details Name: ESBEYDI MANAGO MRN: [id number] DOB: Jan 24, 1946 Today's Date: 03/16/2013 Time: 4098-1191 OT Time Calculation (min): 21 min  OT Assessment / Plan / Recommendation Clinical Impression  Pt presents to OT s/p TKR. Pt with decreased I with ADL activity due to pain and decreased activity tolerance. Pt has 24/7 A at home. Pt will benefit from skilled OT to increase I with ADL activity    OT Assessment  Patient needs continued OT Services    Follow Up Recommendations  Home health OT       Equipment Recommendations  None recommended by OT       Frequency  Min 2X/week    Precautions / Restrictions Precautions Precautions: Knee;Fall Restrictions Weight Bearing Restrictions: No RLE Weight Bearing: Weight bearing as tolerated       ADL  Grooming: Performed;Wash/dry hands;Min guard Where Assessed - Grooming: Unsupported standing Lower Body Dressing: Simulated;Moderate assistance Where Assessed - Lower Body Dressing: Unsupported sit to stand Toilet Transfer: Performed;Min guard Toilet Transfer Method: Sit to Barista: Comfort height toilet Toileting - Clothing Manipulation and Hygiene: Performed;Min guard Where Assessed - Engineer, mining and Hygiene: Standing Equipment Used: Rolling walker Transfers/Ambulation Related to ADLs: Pt has walk in shower at home and raised toilet.    OT Diagnosis: Generalized weakness;Acute pain  OT Problem List: Decreased strength;Pain;Decreased activity tolerance   OT Goals Acute Rehab OT Goals OT Goal Formulation: With patient ADL Goals Pt Will Perform Grooming: with modified independence;Standing at sink ADL Goal: Grooming - Progress: Goal set today Pt Will Perform Lower Body Dressing: with modified independence;with adaptive equipment ADL Goal: Lower Body Dressing - Progress: Goal set today Pt Will Transfer to Toilet: with modified independence;Comfort  height toilet ADL Goal: Toilet Transfer - Progress: Goal set today Pt Will Perform Toileting - Hygiene: with modified independence;Standing at 3-in-1/toilet ADL Goal: Toileting - Hygiene - Progress: Goal set today Pt Will Perform Tub/Shower Transfer: with supervision  Visit Information  Last OT Received On: 03/16/13 Assistance Needed: +2 (safety)    Subjective Data  Subjective: I want to go home- not to rehab.  My boyfriend thinks i need rehab   Prior Functioning     Home Living Lives With: Significant other Available Help at Discharge: Family Type of Home: House Home Access: Stairs to enter Secretary/administrator of Steps: 3 Entrance Stairs-Rails: Left Home Layout: One level Bathroom Shower/Tub: Walk-in shower Home Adaptive Equipment: Environmental consultant - rolling Prior Function Level of Independence: Independent with assistive device(s) Able to Take Stairs?: Yes Driving: Yes Vocation: Retired Musician: No difficulties         Vision/Perception Vision - History Patient Visual Report: No change from baseline   Cognition  Cognition Arousal/Alertness: Awake/alert Behavior During Therapy: WFL for tasks assessed/performed Overall Cognitive Status: Within Functional Limits for tasks assessed    Extremity/Trunk Assessment Right Upper Extremity Assessment RUE ROM/Strength/Tone: WFL for tasks assessed Left Upper Extremity Assessment LUE ROM/Strength/Tone: WFL for tasks assessed     Mobility Bed Mobility Bed Mobility: Not assessed Sit to Supine: 4: Min guard;HOB flat Transfers Transfers: Sit to Stand;Stand to Sit Sit to Stand: 4: Min guard;From chair/3-in-1;From toilet Stand to Sit: 4: Min guard;To chair/3-in-1;To toilet;To bed Details for Transfer Assistance: cues for LE management and use of UEs to self assist     Exercise Total Joint Exercises Ankle Circles/Pumps: AROM;Both;10 reps Quad Sets: AROM;Both;10 reps Heel Slides: AROM;Right;10  reps;AAROM Hip ABduction/ADduction: AROM;AAROM;10 reps;Right      End  of Session OT - End of Session Activity Tolerance: Patient tolerated treatment well Patient left: in bed  GO     Eivan Gallina, [place] 03/16/2013, 11:46 AM

## 2013-03-16 NOTE — Progress Notes (Signed)
   Subjective: 2 Days Post-Op Procedure(s) (LRB): RIGHT TOTAL KNEE ARTHROPLASTY (Right)   Patient reports pain as mild, pain well controlled. No events throughout the night.  Objective:   VITALS:   Filed Vitals:   03/16/13  BP: 161/83  Pulse: 95  Temp: 98.1 F (36.7 C)   Resp: 14    Neurovascular intact Dorsiflexion/Plantar flexion intact Incision: dressing C/D/I No cellulitis present Compartment soft  LABS  Recent Labs  03/15/13 0447 03/16/13 0528  HGB 11.9* 12.9  HCT 36.3 39.1  WBC 10.6* 14.4*  PLT 233 294     Recent Labs  03/15/13 0447 03/16/13 0528  NA 134* 135  K 4.1 3.8  BUN 14 10  CREATININE 1.05 1.00  GLUCOSE 187* 144*     Assessment/Plan: 2 Days Post-Op Procedure(s) (LRB): RIGHT TOTAL KNEE ARTHROPLASTY (Right) Up with therapy Discharge home with home health vs SNF tomorrow, depends on how well she advances  Morbid Obesity (BMI >40)  Estimated body mass index is 46.87 kg/(m^2) as calculated from the following:      Height as of this encounter: 5' (1.524 m).      Weight as of this encounter: 108.863 kg (240 lb).  Patient also counseled that weight may inhibit the healing process  Patient counseled that losing weight will help with future health issues         Anastasio Auerbach. [place]   PAC  03/16/2013, 9:45 AM

## 2013-03-16 NOTE — Progress Notes (Signed)
Physical Therapy Treatment Patient Details Name: Chelsea [name] MRN: [id number] DOB: February 06, 1946 Today's Date: 03/16/2013 Time: 7829-5621 PT Time Calculation (min): 28 min  PT Assessment / Plan / Recommendation Comments on Treatment Session  pt still arguing with her boyfriend regarding placement; pt would benefit form SNF but not completely agreeable    Follow Up Recommendations  Home health PT;Supervision/Assistance - 24 hour     Does the patient have the potential to tolerate intense rehabilitation     Barriers to Discharge        Equipment Recommendations  Rolling walker with 5" wheels    Recommendations for Other Services OT consult  Frequency 7X/week   Plan Discharge plan remains appropriate    Precautions / Restrictions Precautions Precautions: Knee;Fall Restrictions Weight Bearing Restrictions: No RLE Weight Bearing: Weight bearing as tolerated   Pertinent Vitals/Pain Pain controlled     Mobility  Bed Mobility Bed Mobility: Not assessed Transfers Transfers: Sit to Stand;Stand to Sit Sit to Stand: 4: Min assist;From chair/3-in-1;With upper extremity assist Stand to Sit: 4: Min assist;To chair/3-in-1;With upper extremity assist Details for Transfer Assistance: cues for LE management and use of UEs to self assist Ambulation/Gait Ambulation/Gait Assistance: 4: Min assist Ambulation Distance (Feet): 25 Feet Assistive device: Rolling walker Ambulation/Gait Assistance Details: cues for posture, sequence, position from RW, stride length and to SLOW DOWN Gait Pattern: Step-to pattern;Decreased step length - right;Decreased step length - left;Antalgic;Decreased stance time - right    Exercises Total Joint Exercises Ankle Circles/Pumps: AROM;Both;10 reps Quad Sets: AROM;Both;10 reps Heel Slides: AROM;Right;10 reps;AAROM Hip ABduction/ADduction: AROM;AAROM;10 reps;Right   PT Diagnosis:    PT Problem List:   PT Treatment Interventions:     PT Goals Acute Rehab PT  Goals Time For Goal Achievement: 03/22/13 Potential to Achieve Goals: Good Pt will go Supine/Side to Sit: with supervision PT Goal: Supine/Side to Sit - Progress: Progressing toward goal Pt will go Sit to Supine/Side: with supervision PT Goal: Sit to Supine/Side - Progress: Progressing toward goal Pt will go Sit to Stand: with supervision PT Goal: Sit to Stand - Progress: Progressing toward goal Pt will go Stand to Sit: with supervision PT Goal: Stand to Sit - Progress: Progressing toward goal Pt will Ambulate: 51 - 150 feet;with supervision;with rolling walker PT Goal: Ambulate - Progress: Progressing toward goal  Visit Information  Last PT Received On: 03/16/13 Assistance Needed: +2 (safety)    Subjective Data  Subjective: I am doing better Patient Stated Goal: Resume previous lifestyle with decreased pain   Cognition  Cognition Arousal/Alertness: Awake/alert Behavior During Therapy: WFL for tasks assessed/performed Overall Cognitive Status: Within Functional Limits for tasks assessed    Balance     End of Session PT - End of Session Equipment Utilized During Treatment: Gait belt Patient left: in chair;with call bell/phone within reach   GP     [facility] 03/16/2013, 11:06 AM

## 2013-03-17 LAB — GLUCOSE, CAPILLARY
Glucose-Capillary: 126 mg/dL — ABNORMAL HIGH (ref 70–99)
Glucose-Capillary: 108 mg/dL — ABNORMAL HIGH (ref 70–99)
Glucose-Capillary: 123 mg/dL — ABNORMAL HIGH (ref 70–99)
Glucose-Capillary: 129 mg/dL — ABNORMAL HIGH (ref 70–99)

## 2013-03-17 MED ORDER — DSS 100 MG PO CAPS: 100.0000 mg | ORAL_CAPSULE | Freq: Two times a day (BID) | ORAL | Status: DC

## 2013-03-17 MED ORDER — METHOCARBAMOL 500 MG PO TABS: 500.0000 mg | ORAL_TABLET | Freq: Four times a day (QID) | ORAL | Status: DC | PRN

## 2013-03-17 MED ORDER — TRAMADOL HCL 50 MG PO TABS
50.0000 mg | ORAL_TABLET | Freq: Four times a day (QID) | ORAL | Status: DC | PRN
  Administered 2013-03-17 – 2013-03-18 (×2): 50 mg via ORAL
  Filled 2013-03-17 (×2): qty 1

## 2013-03-17 MED ORDER — ASPIRIN EC 325 MG PO TBEC: 325.0000 mg | DELAYED_RELEASE_TABLET | Freq: Two times a day (BID) | ORAL | Status: DC

## 2013-03-17 MED ORDER — POLYETHYLENE GLYCOL 3350 17 G PO PACK: 17.0000 g | PACK | Freq: Every day | ORAL | Status: DC | PRN

## 2013-03-17 MED ORDER — TRAMADOL HCL 50 MG PO TABS: 50.0000 mg | ORAL_TABLET | Freq: Four times a day (QID) | ORAL | Status: DC | PRN

## 2013-03-17 MED ORDER — FERROUS SULFATE 325 (65 FE) MG PO TABS: 325.0000 mg | ORAL_TABLET | Freq: Three times a day (TID) | ORAL | Status: DC

## 2013-03-17 NOTE — Progress Notes (Signed)
Occupational Therapy Treatment Patient Details Name: Chelsea [name] MRN: [id number] DOB: January 24, 1946 Today's Date: 03/17/2013 Time: 4098-1191 OT Time Calculation (min): 8 min  OT Assessment / Plan / Recommendation    Follow Up Recommendations  Home health OT;Supervision/Assistance - 24 hour;SNF             Frequency Min 2X/week   Plan Discharge plan needs to be updated    Precautions / Restrictions Precautions Precautions: Knee;Fall Restrictions Weight Bearing Restrictions: No RLE Weight Bearing: Weight bearing as tolerated       ADL  Transfers/Ambulation Related to ADLs: Pt performed sit to stand and needed to sit due to "boxes moving" on top of the closet. RN aware.  Pt may need SNF rather than HH depending on if confusion clears.      OT Goals ADL Goals ADL Goal: Toilet Transfer - Progress: Other (comment) (confusion limiting pt this day)  Visit Information  Last OT Received On: 03/17/13 Assistance Needed: +2    Subjective Data  Subjective: Upon standing- pt said the boxes were moving on top of the closet      Cognition  Cognition Arousal/Alertness: Awake/alert Behavior During Therapy: Impulsive Overall Cognitive Status: Impaired/Different from baseline Area of Impairment: Safety/judgement Following Commands: Follows one step commands inconsistently Safety/Judgement: Decreased awareness of safety;Decreased awareness of deficits    Mobility  Bed Mobility Bed Mobility: Supine to Sit Supine to Sit: 4: Min assist;HOB flat Details for Bed Mobility Assistance: cues for technique, self assist and safety Transfers Transfers: Sit to Stand;Stand to Sit Sit to Stand: 4: Min assist;From chair/3-in-1;With armrests Stand to Sit: 4: Min assist;To chair/3-in-1;With armrests Details for Transfer Assistance: cues for LE management and use of UEs to self assist    Exercises  Total Joint Exercises Heel Slides: AAROM;AROM;Right;5 reps Straight Leg Raises:  AROM;AAROM;Right;5 reps   Balance Balance Balance Assessed: Yes Dynamic Standing Balance Dynamic Standing - Balance Support: Left upper extremity supported;During functional activity Dynamic Standing - Level of Assistance: 4: Min assist   End of Session OT - End of Session Activity Tolerance: Other (comment) (limited by confusion) Patient left: in chair;with call bell/phone within reach;with nursing in room       [facility], [place] 03/17/2013, 10:47 AM

## 2013-03-17 NOTE — Progress Notes (Signed)
[employer] PA paged, patient continues to be impulsive and confused.  Discharge cancelled  [facility]  RN

## 2013-03-17 NOTE — Progress Notes (Signed)
Patients niece here states i  thought she was discharged today, explained to family member that patient is confused and having hallucinations.MD is aware and wants to watch her further today,  Freddie Breech PA paged and spoke with family member.  Niece recognizes that she is confused, states she will call and get an update later today.  Sharrell Ku RN

## 2013-03-17 NOTE — Progress Notes (Signed)
Physical Therapy Treatment Patient Details Name: Chelsea [name] MRN: [id number] DOB: 05-05-1946 Today's Date: 03/17/2013 Time: 2841-3244 PT Time Calculation (min): 39 min  PT Assessment / Plan / Recommendation Comments on Treatment Session  pt very unsafe today, having visual and auditory hallucinations and reporting seeing man with a knife and paper coming out of box ("don't you hear it?!".Marland Kitchenpointing to closet in room); Pt is a significant fall risk at this time and reports a hx of falls at home; will continue to follow; recommend SNF at this point however pt does not appear to be agreeable; pt very dyspneic this am; HR 129; RN aware of all previous issues    Follow Up Recommendations  Supervision/Assistance - 24 hour;SNF     Does the patient have the potential to tolerate intense rehabilitation     Barriers to Discharge        Equipment Recommendations  Rolling walker with 5" wheels (wide, short RW)    Recommendations for Other Services    Frequency 7X/week   Plan Discharge plan needs to be updated;Frequency remains appropriate    Precautions / Restrictions Precautions Precautions: Knee;Fall Restrictions Weight Bearing Restrictions: No RLE Weight Bearing: Weight bearing as tolerated   Pertinent Vitals/Pain Denies pain    Mobility  Bed Mobility Bed Mobility: Supine to Sit Supine to Sit: 4: Min assist;HOB flat Details for Bed Mobility Assistance: cues for technique, self assist and safety Transfers Transfers: Sit to Stand;Stand to Sit Sit to Stand: 4: Min guard;4: Min assist;From bed;From chair/3-in-1 Stand to Sit: 4: Min guard;4: Min assist;To chair/3-in-1 Details for Transfer Assistance: cues for LE management and use of UEs to self assist Ambulation/Gait Ambulation/Gait Assistance: 4: Min assist Ambulation Distance (Feet): 25 Feet (10) Assistive device: Rolling walker Ambulation/Gait Assistance Details: cues for posture, sequence, position from RW, stride length and to  SLOW DOWN Gait Pattern: Step-to pattern General Gait Details: pt extremely undafe with amb, letting goof and leaving RW, holding onto bedrails, etc. requring max multi-modal cues for safety adn to prevent falls Stairs: Yes Stairs Assistance: 4: Min assist;4: Min guard Stairs Assistance Details (indicate cue type and reason): cues for sequence and to slow down and breathe Stair Management Technique: One rail Left;With cane;Forwards Number of Stairs: 3    Exercises Total Joint Exercises Heel Slides: AAROM;AROM;Right;5 reps Straight Leg Raises: AROM;AAROM;Right;5 reps   PT Diagnosis:    PT Problem List:   PT Treatment Interventions:     PT Goals Acute Rehab PT Goals Time For Goal Achievement: 03/22/13 Potential to Achieve Goals: Good Pt will go Supine/Side to Sit: with supervision PT Goal: Supine/Side to Sit - Progress: Progressing toward goal Pt will go Sit to Stand: with supervision PT Goal: Sit to Stand - Progress: Progressing toward goal Pt will go Stand to Sit: with supervision PT Goal: Stand to Sit - Progress: Progressing toward goal Pt will Ambulate: 51 - 150 feet;with supervision;with rolling walker PT Goal: Ambulate - Progress: Progressing toward goal Pt will Go Up / Down Stairs: 3-5 stairs;with min assist;with least restrictive assistive device PT Goal: Up/Down Stairs - Progress: Met  Visit Information  Last PT Received On: 03/17/13 Assistance Needed: +2    Subjective Data      Cognition  Cognition Arousal/Alertness: Awake/alert Behavior During Therapy: Impulsive Overall Cognitive Status: Impaired/Different from baseline Area of Impairment: Following commands;Safety/judgement;Awareness Following Commands: Follows one step commands inconsistently Safety/Judgement: Decreased awareness of safety;Decreased awareness of deficits    Balance  Balance Balance Assessed: Yes Dynamic Standing Balance  Dynamic Standing - Balance Support: Left upper extremity  supported;During functional activity Dynamic Standing - Level of Assistance: 4: Min assist  End of Session PT - End of Session Equipment Utilized During Treatment: Gait belt Activity Tolerance: Patient limited by fatigue Patient left: in chair;with call bell/phone within reach;with nursing in room Nurse Communication: Other (comment);Mobility status   GP     [facility] 03/17/2013, 10:08 AM

## 2013-03-17 NOTE — Progress Notes (Signed)
   Subjective: 3 Days Post-Op Procedure(s) (LRB): RIGHT TOTAL KNEE ARTHROPLASTY (Right)   Patient reports pain as mild, pain well controlled. Nurse stated that the patient was starting to see things last night and some this morning. Though she seemed fine when talking with her this morning. We will see if this clears some throughout the day, prior to allowing her to be discharged home.  Objective:   VITALS:   Filed Vitals:   03/17/13 0616  BP: 128/73  Pulse: 108  Temp: 98.6 F (37 C)  Resp: 20    Neurovascular intact Dorsiflexion/Plantar flexion intact Incision: dressing C/D/I No cellulitis present Compartment soft  LABS  Recent Labs  03/15/13 0447 03/16/13 0528  HGB 11.9* 12.9  HCT 36.3 39.1  WBC 10.6* 14.4*  PLT 233 294     Recent Labs  03/15/13 0447 03/16/13 0528  NA 134* 135  K 4.1 3.8  BUN 14 10  CREATININE 1.05 1.00  GLUCOSE 187* 144*     Assessment/Plan: 3 Days Post-Op Procedure(s) (LRB): RIGHT TOTAL KNEE ARTHROPLASTY (Right) ACE bandage removed HV dressing removed Up with therapy Discharge home with home health if her head clears, nurse is aware and will keep [place] informed Norco was d/c'de and changed to tramadol if needed Follow up in 2 weeks at [facility]. Follow up with OLIN,Harden Bramer D in 2 weeks.  Contact information:  [facility] [address] [place] [address] [phone]    Morbid Obesity (BMI >40)  Estimated body mass index is 46.87 kg/(m^2) as calculated from the following:      Height as of this encounter: 5' (1.524 m).      Weight as of this encounter: 108.863 kg (240 lb).  Patient also counseled that weight may inhibit the healing process  Patient counseled that losing weight will help with future health issues        Anastasio Auerbach. Angelus Hoopes   PAC  03/17/2013, 8:47 AM

## 2013-03-17 NOTE — Progress Notes (Signed)
03/17/13 1200  PT Visit Information  Last PT Received On 03/17/13  Assistance Needed +1  PT Time Calculation  PT Start Time 1145  PT Stop Time 1208  PT Time Calculation (min) 23 min  Subjective Data  Subjective I am going home  Precautions  Precautions Knee;Fall  Restrictions  RLE Weight Bearing WBAT  Cognition  Arousal/Alertness Awake/alert  Behavior During Therapy Restless  Safety/Judgement Decreased awareness of safety;Decreased awareness of deficits  General Comments pt continues to be mildly impulsive although better than am session; responding to safety cues but still with some apparent hallucinations referring to "pipes out the window" and people that were not in room  Bed Mobility  Bed Mobility Not assessed  Transfers  Transfers Sit to Stand;Stand to Sit  Sit to Stand 4: Min guard;From chair/3-in-1;With upper extremity assist  Stand to Sit 4: Min guard;To chair/3-in-1;With upper extremity assist  Details for Transfer Assistance cues for LE management and use of UEs to self assist  Ambulation/Gait  Ambulation/Gait Assistance 4: Min guard  Ambulation Distance (Feet) 35 Feet (10)  Assistive device Rolling walker  Ambulation/Gait Assistance Details cues for posture, sequence, position from RW, stride length and to SLOW DOWN  Gait Pattern Step-to pattern  General Gait Details pt with improved response to safety cues thsi pm;   Total Joint Exercises  Ankle Circles/Pumps AROM;Both;10 reps  Quad Sets AROM;Both;10 reps  Heel Slides AROM;AAROM;Right;5 reps  Straight Leg Raises AROM;AAROM;10 reps;Right  Long Arc Quad AROM;AAROM;Right;10 reps  PT - End of Session  Equipment Utilized During Treatment Gait belt  Activity Tolerance Patient limited by fatigue  Patient left in chair;with call bell/phone within reach;with nursing in room  Nurse Communication Mobility status  PT - Assessment/Plan  Comments on Treatment Session better this pm overall; pt states she is going home  and will have assist from her boyfriend (who has lupus, is blind in one eye and can't give any physical assist per pt)  PT Plan Discharge plan remains appropriate;Frequency remains appropriate  PT Frequency 7X/week  Follow Up Recommendations Supervision/Assistance - 24 hour (HH vs SNF--pt will not agree to placment)  PT equipment Rolling walker with 5" wheels (wide)  Acute Rehab PT Goals  PT Goal Formulation With patient  Time For Goal Achievement 03/22/13  Potential to Achieve Goals Good  Pt will go Sit to Stand with supervision  PT Goal: Sit to Stand - Progress Progressing toward goal  Pt will go Stand to Sit with supervision  PT Goal: Stand to Sit - Progress Progressing toward goal  Pt will Ambulate 51 - 150 feet;with supervision;with rolling walker  PT Goal: Ambulate - Progress Progressing toward goal  PT General Charges  $$ ACUTE PT VISIT 1 Procedure  PT Treatments  $Gait Training 8-22 mins  $Therapeutic Exercise 8-22 mins

## 2013-03-18 LAB — GLUCOSE, CAPILLARY: Glucose-Capillary: 147 mg/dL — ABNORMAL HIGH (ref 70–99)

## 2013-03-18 NOTE — Progress Notes (Signed)
Occupational Therapy Treatment Patient Details Name: Chelsea [name] MRN: [id number] DOB: September 10, 1946 Today's Date: 03/18/2013 Time: 1191-4782 OT Time Calculation (min): 14 min  OT Assessment / Plan / Recommendation Comments on Treatment Session      Follow Up Recommendations  Supervision/Assistance - 24 hour;Home health OT    Barriers to Discharge       Equipment Recommendations       Recommendations for Other Services    Frequency Min 2X/week   Plan Discharge plan remains appropriate    Precautions / Restrictions Precautions Precautions: Knee;Fall Restrictions RLE Weight Bearing: Weight bearing as tolerated   Pertinent Vitals/Pain No c/o pain    ADL  Lower Body Bathing: Minimal assistance Where Assessed - Lower Body Bathing: Supported sit to stand Lower Body Dressing: Moderate assistance (underwear pants socks tennis shoes) Where Assessed - Lower Body Dressing: Supported sit to stand Toilet Transfer: Simulated;Min guard (bed, ambulated bathroom, bed; cues for safety) Toilet Transfer Method: Sit to stand Tub/Shower Transfer: Min guard Tub/Shower Transfer Method: Science writer: Walk in Scientist, research (physical sciences) Used: Rolling walker Transfers/Ambulation Related to ADLs: cues for safety:  pt initially stood without walker when performing adls ADL Comments: cues for sequence.  Pt is very independent, gets dyspnea 2/4 with exertion.  Has reacher at home    OT Diagnosis:    OT Problem List:   OT Treatment Interventions:     OT Goals ADL Goals Pt Will Perform Lower Body Dressing: with modified independence;with adaptive equipment ADL Goal: Lower Body Dressing - Progress: Progressing toward goals Pt Will Transfer to Toilet: with modified independence;Comfort height toilet ADL Goal: Toilet Transfer - Progress: Progressing toward goals Pt Will Perform Tub/Shower Transfer: with supervision ADL Goal: Tub/Shower Transfer - Progress: Progressing toward  goals  Visit Information  Last OT Received On: 03/18/13 Assistance Needed: +1    Subjective Data      Prior Functioning       Cognition  Cognition Arousal/Alertness: Awake/alert Behavior During Therapy: Impulsive Overall Cognitive Status: Impaired/Different from baseline (decreased safety) Area of Impairment: Safety/judgement Safety/Judgement: Decreased awareness of safety;Decreased awareness of deficits General Comments: pt moves fast.  Very independent--needs cues for safety    Mobility  Transfers Sit to Stand: 5: Supervision;From chair/3-in-1 Details for Transfer Assistance: cues for walker/safety    Exercises      Balance     End of Session OT - End of Session Activity Tolerance: Patient limited by fatigue Patient left: in bed;with call bell/phone within reach  GO     [facility] 03/18/2013, 8:48 AM Marica Otter, OTR/L [phone] 03/18/2013

## 2013-03-18 NOTE — Progress Notes (Signed)
Physical Therapy Treatment Patient Details Name: Chelsea [name] MRN: [id number] DOB: 1946-10-18 Today's Date: 03/18/2013 Time: 4098-1191 PT Time Calculation (min): 24 min  PT Assessment / Plan / Recommendation Comments on Treatment Session  pt continues to be very independent/mildly impulsive, although much better today; no hallucinations and pt following commands consistently    Follow Up Recommendations  Home health PT;Supervision/Assistance - 24 hour     Does the patient have the potential to tolerate intense rehabilitation     Barriers to Discharge        Equipment Recommendations  Rolling walker with 5" wheels;None recommended by PT    Recommendations for Other Services    Frequency 7X/week   Plan Frequency remains appropriate;Discharge plan needs to be updated    Precautions / Restrictions Precautions Precautions: Knee;Fall Restrictions RLE Weight Bearing: Weight bearing as tolerated   Pertinent Vitals/Pain No c/o pain    Mobility  Bed Mobility Bed Mobility: Not assessed Transfers Transfers: Sit to Stand;Stand to Sit Sit to Stand: 5: Supervision;From bed;With upper extremity assist Stand to Sit: 5: Supervision;To bed;With upper extremity assist Details for Transfer Assistance: cues for safety Ambulation/Gait Ambulation/Gait Assistance: 5: Supervision;4: Min guard Ambulation Distance (Feet): 42 Feet Assistive device: Rolling walker Ambulation/Gait Assistance Details: cues for posture, sequence, position from RW, stride length and to SLOW DOWN Gait Pattern: Step-to pattern General Gait Details: pt with improved safety wtih gait    Exercises Total Joint Exercises Hip ABduction/ADduction: AROM;Both;Seated Long Arc Quad: AROM;Right;15 reps Goniometric ROM: 92* General Exercises - Lower Extremity Heel Raises: AROM;Both;15 reps;Seated   PT Diagnosis:    PT Problem List:   PT Treatment Interventions:     PT Goals Acute Rehab PT Goals Time For Goal  Achievement: 03/22/13 Potential to Achieve Goals: Good Pt will go Sit to Stand: with supervision PT Goal: Sit to Stand - Progress: Met Pt will go Stand to Sit: with supervision PT Goal: Stand to Sit - Progress: Met Pt will Ambulate: 51 - 150 feet;with supervision;with rolling walker PT Goal: Ambulate - Progress: Partly met  Visit Information  Last PT Received On: 03/18/13 Assistance Needed: +1    Subjective Data  Subjective: I am going home   Cognition  Cognition Arousal/Alertness: Awake/alert Behavior During Therapy: Impulsive Overall Cognitive Status: Within Functional Limits for tasks assessed Area of Impairment: Safety/judgement Following Commands: Follows one step commands consistently Safety/Judgement: Decreased awareness of safety;Decreased awareness of deficits General Comments: pt moves fast.  Very independent    Balance     End of Session PT - End of Session Equipment Utilized During Treatment: Gait belt Activity Tolerance: Patient tolerated treatment well Patient left: Other (comment);with call bell/phone within reach (EOB)   GP     [facility] 03/18/2013, 12:03 PM

## 2013-03-18 NOTE — Progress Notes (Signed)
   Subjective: 4 Days Post-Op Procedure(s) (LRB): RIGHT TOTAL KNEE ARTHROPLASTY (Right) Patient reports pain as mild.   Patient seen in rounds with Dr. Lequita Halt.  Patient doing much better this morning.  Had a better night. Patient is well, and has had no acute complaints or problems Patient is ready to go home.  Objective: Vital signs in last 24 hours: Temp:  [98.5 F (36.9 C)-98.6 F (37 C)] 98.6 F (37 C) (05/10 0554) Pulse Rate:  [97-104] 97 (05/10 0554) Resp:  [18-20] 18 (05/10 0554) BP: (114-134)/(73-79) 134/79 mmHg (05/10 0554) SpO2:  [95 %-97 %] 95 % (05/10 0554)  Intake/Output from previous day:  Intake/Output Summary (Last 24 hours) at 03/18/13 0738 Last data filed at 03/17/13 1700  Gross per 24 hour  Intake    540 ml  Output    400 ml  Net    140 ml    Intake/Output this shift:    Labs:  Recent Labs  03/16/13 0528  HGB 12.9    Recent Labs  03/16/13 0528  WBC 14.4*  RBC 4.61  HCT 39.1  PLT 294    Recent Labs  03/16/13 0528  NA 135  K 3.8  CL 99  CO2 25  BUN 10  CREATININE 1.00  GLUCOSE 144*  CALCIUM 10.0   No results found for this basename: LABPT, INR,  in the last 72 hours  EXAM: General - Patient is Alert, Appropriate and Oriented Extremity - Neurovascular intact Sensation intact distally Dorsiflexion/Plantar flexion intact No cellulitis present Dressing - intact, Motor Function - intact, moving foot and toes well on exam.   Assessment/Plan: 4 Days Post-Op Procedure(s) (LRB): RIGHT TOTAL KNEE ARTHROPLASTY (Right) Procedure(s) (LRB): RIGHT TOTAL KNEE ARTHROPLASTY (Right) Past Medical History  Diagnosis Date  . Tobacco user   . Chronic back pain   . GERD (gastroesophageal reflux disease)   . Obesity   . Vaginal cancer   . DM II (diabetes mellitus, type II), controlled 09/22/11  . Headache     MIGRAINES  . Arthritis   . Rash     L ARM  . Recurrent UTI (urinary tract infection)   . OSA (obstructive sleep apnea)    USES C-PAP  . Depression   . Bipolar disorder   . Skin cancer     L HAND  . Complication of anesthesia     "I GOT TOO MUCH MEDICINE AND I DIED TWICE" (ANESTHESIA NOTES ON CHART)   Principal Problem:   S/P right TKA Active Problems:   Morbid obesity  Estimated body mass index is 46.87 kg/(m^2) as calculated from the following:   Height as of this encounter: 5' (1.524 m).   Weight as of this encounter: 108.863 kg (240 lb). Discharge home with home health Diet - Diabetic diet Follow up - in 2 weeks Activity - WBAT Disposition - Home Condition Upon Discharge - imrpoving D/C Meds - See DC Summary  PERKINS, ALEXZANDREW 03/18/2013, 7:38 AM

## 2013-03-22 NOTE — Discharge Summary (Signed)
Physician Discharge Summary  Patient ID: ROLLANDE THURSBY MRN: [id number] DOB/AGE: 67/21/1947 68 y.o.  Admit date: 03/14/2013 Discharge date: 03/18/2013   Procedures:  Procedure(s) (LRB): RIGHT TOTAL KNEE ARTHROPLASTY (Right)  Attending Physician:  Dr. Durene Romans   Admission Diagnoses:   Right knee OA / pain  Discharge Diagnoses:  Principal Problem:   S/P right TKA Active Problems:   Morbid obesity  Past Medical History  Diagnosis Date  . Tobacco user   . Chronic back pain   . GERD (gastroesophageal reflux disease)   . Obesity   . Vaginal cancer   . DM II (diabetes mellitus, type II), controlled 09/22/11  . Headache     MIGRAINES  . Arthritis   . Rash     L ARM  . Recurrent UTI (urinary tract infection)   . OSA (obstructive sleep apnea)     USES C-PAP  . Depression   . Bipolar disorder   . Skin cancer     L HAND  . Complication of anesthesia     "I GOT TOO MUCH MEDICINE AND I DIED TWICE" (ANESTHESIA NOTES ON CHART)    HPI:    Chelsea [name], 67 y.o. female, has a history of pain and functional disability in the right knee due to arthritis and has failed non-surgical conservative treatments for greater than 12 weeks to includeNSAID's and/or analgesics, corticosteriod injections, viscosupplementation injections, use of assistive devices and activity modification. Onset of symptoms was gradual, starting 2 years ago with gradually worsening course since that time. The patient noted no past surgery on the right knee(s). Patient currently rates pain in the right knee(s) at 10 out of 10 with activity. Patient has night pain, worsening of pain with activity and weight bearing, pain that interferes with activities of daily living, pain with passive range of motion, crepitus and joint swelling. Patient has evidence of periarticular osteophytes and joint space narrowing by imaging studies. There is no active infection. Risks, benefits and expectations were discussed with the  patient. Patient understand the risks, benefits and expectations and wishes to proceed with surgery.  PCP: Dina Rich, MD   Discharged Condition: good  Hospital Course:  Patient underwent the above stated procedure on 03/14/2013. Patient tolerated the procedure well and brought to the recovery room in good condition and subsequently to the floor.  POD #1 BP: 124/85 ; Pulse: 86 ; Temp: 98.5 F (36.9 C) ; Resp: 14  Pt's foley was removed, as well as the hemovac drain removed. IV was changed to a saline lock. Patient reports pain as mild, Pain well controlled. No events throughout the night.  Neurovascular intact, dorsiflexion/plantar flexion intact, incision: dressing C/D/I, no cellulitis present and compartment soft.   LABS  Basename  03/15/13    0447   HGB  11.9  HCT  36.3   POD #2  BP: 161/83 ; Pulse: 95 ; Temp: 98.1 F (36.7 C) ; Resp: 14 Patient reports pain as mild, pain well controlled. No events throughout the night. Neurovascular intact, dorsiflexion/plantar flexion intact, incision: dressing C/D/I, no cellulitis present and compartment soft.   LABS  Basename  03/16/13    0528   HGB  12.9  HCT  39.1   POD #3  BP: 128/73 ; Pulse: 108 ; Temp: 98.6 F (37 C) ; Resp: 20 Patient reports pain as mild, pain well controlled. Nurse stated that the patient was starting to see things last night and some this morning. Though she seemed fine when talking  with her this morning. Pain medicine changed from Norco to tramadol. Will be able to discharge home once her mind clears.  LABS   No new labs  POD #4  BP: 134/79 ; Pulse: 97 ; Temp: 98.6 F (37 C) ; Resp: 18 Patient reports pain as mild. Patient doing much better this morning. Had a better night. No acute complaints or problems. Ready to go home.  LABS   No new labs   Discharge Exam: General appearance: alert, cooperative and no distress Extremities: Homans sign is negative, no sign of DVT, no edema, redness or tenderness in  the calves or thighs and no ulcers, gangrene or trophic changes  Disposition:   Home or Self Care with follow up in 2 weeks   Follow-up Information   Follow up with Shelda Pal, MD. Schedule an appointment as soon as possible for a visit in 2 weeks.   Contact information:   [address] [place] [address] [phone]       Discharge Orders   Future Appointments Provider Department Dept Phone   09/21/2013 10:15 AM Waymon Budge, MD [facility] [phone]   Future Orders Complete By Expires     Call MD / Call 911  As directed     Comments:      If you experience chest pain or shortness of breath, CALL 911 and be transported to the hospital emergency room.  If you develope a fever above 101 F, pus (white drainage) or increased drainage or redness at the wound, or calf pain, call your surgeon's office.    Change dressing  As directed     Comments:      Maintain surgical dressing for 10-14 days, then change the dressing daily with sterile 4 x 4 inch gauze dressing and tape. Keep the area dry and clean.    Constipation Prevention  As directed     Comments:      Drink plenty of fluids.  Prune juice may be helpful.  You may use a stool softener, such as Colace (over the counter) 100 mg twice a day.  Use MiraLax (over the counter) for constipation as needed.    Diet - low sodium heart healthy  As directed     Discharge instructions  As directed     Comments:      Maintain surgical dressing for 10-14 days, then replace with gauze and tape. Keep the area dry and clean until follow up. Follow up in 2 weeks at [facility]. Call with any questions or concerns.    Driving restrictions  As directed     Comments:      No driving for 4 weeks    Increase activity slowly as tolerated  As directed     TED hose  As directed     Comments:      Use stockings (TED hose) for 2 weeks on both leg(s).  You may remove them at night for sleeping.    Weight  bearing as tolerated  As directed          Medication List    STOP taking these medications       naproxen 500 MG EC tablet  Commonly known as:  EC NAPROSYN      TAKE these medications       aspirin EC 325 MG tablet  Take 1 tablet (325 mg total) by mouth 2 (two) times daily.     atorvastatin 20 MG tablet  Commonly known as:  LIPITOR  Take 1 tablet by mouth daily before breakfast.     beta carotene w/minerals tablet  Take 1 tablet by mouth daily.     buPROPion 200 MG 12 hr tablet  Commonly known as:  WELLBUTRIN SR  Take 200 mg by mouth 2 (two) times daily.     DSS 100 MG Caps  Take 100 mg by mouth 2 (two) times daily.     ferrous sulfate 325 (65 FE) MG tablet  Take 1 tablet (325 mg total) by mouth 3 (three) times daily after meals.     losartan 25 MG tablet  Commonly known as:  COZAAR  Take 25 mg by mouth daily before breakfast.     methocarbamol 500 MG tablet  Commonly known as:  ROBAXIN  Take 1 tablet (500 mg total) by mouth every 6 (six) hours as needed (muscle spasms).     multivitamin tablet  Take 1 tablet by mouth daily.     nitrofurantoin 100 MG capsule  Commonly known as:  MACRODANTIN  Take 100 mg by mouth daily.     OVER THE COUNTER MEDICATION  Take 1 tablet by mouth daily. Mega life restless legs vitamin     polyethylene glycol packet  Commonly known as:  MIRALAX / GLYCOLAX  Take 17 g by mouth daily as needed.     traMADol 50 MG tablet  Commonly known as:  ULTRAM  Take 1-2 tablets (50-100 mg total) by mouth every 6 (six) hours as needed for pain.     venlafaxine XR 75 MG 24 hr capsule  Commonly known as:  EFFEXOR-XR  Take 75 mg by mouth daily before breakfast.     venlafaxine XR 150 MG 24 hr capsule  Commonly known as:  EFFEXOR-XR  Take 150 mg by mouth at bedtime.         Signed: Anastasio Auerbach. Cate Oravec   PAC  03/22/2013, 8:12 AM

## 2013-07-31 ENCOUNTER — Telehealth: Payer: Self-pay | Admitting: Internal Medicine

## 2013-07-31 NOTE — Telephone Encounter (Signed)
Pt aware forms completed and mailed.Chelsea [name]

## 2013-07-31 NOTE — Telephone Encounter (Signed)
Forms completed, copied for our records(EPIC), and mailed to patient(in self addressed envelope) on Friday 07-28-13.

## 2013-07-31 NOTE — Telephone Encounter (Signed)
I spoke with pt. She stated she mailed in paperwork to [place] last week. Katie please advise if you received anything thanks

## 2013-07-31 NOTE — Telephone Encounter (Signed)
lmomtcb x1 for pt to make her aware.

## 2013-09-21 ENCOUNTER — Encounter: Payer: Self-pay | Admitting: Internal Medicine

## 2013-09-21 ENCOUNTER — Ambulatory Visit (INDEPENDENT_AMBULATORY_CARE_PROVIDER_SITE_OTHER): Payer: Medicare Other | Admitting: Internal Medicine

## 2013-09-21 VITALS — BP 128/76 | HR 90 | Ht 60.0 in | Wt 267.8 lb

## 2013-09-21 DIAGNOSIS — R911 Solitary pulmonary nodule: Secondary | ICD-10-CM

## 2013-09-21 DIAGNOSIS — R1314 Dysphagia, pharyngoesophageal phase: Secondary | ICD-10-CM

## 2013-09-21 DIAGNOSIS — G4733 Obstructive sleep apnea (adult) (pediatric): Secondary | ICD-10-CM

## 2013-09-21 NOTE — Patient Instructions (Signed)
We can continue CPAP 9/ [facility]- you are doing well with this  Ask Dr Sol Passer if a Modified Barium Swallow would be appropriate to evaluate your complaint of difficulty swallowing, and food hang-up in your throat with choking when you swallow.

## 2013-09-21 NOTE — Progress Notes (Signed)
09/22/11- 67 yo F former smoker followed for obstructive sleep apnea complicated by history of depression, chronic back pain, GERD, obesity, DM.- PCP Dr Durel Salts Last here 09/23/2010 Since last here she has been diagnosed with diabetes. Has had flu vaccine. 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 She went back to using her old CPAP with a bad switch, set at 60 / [place]. She does use it all night every night and during naps is comfortable with it. Being followed at [facility] for a nodule on right upper lobe, every 3 months with no biopsy.  09/21/12-66 yo F former smoker followed for obstructive sleep apnea complicated by history of depression, chronic back pain, GERD, obesity, DM., lung nodule ([facility]) .- PCP Dr Sol Passer Wears CPAP 9/[facility] every night and during the day if taking a nap. Pressure working well for patient. Straps rub her skin tags. She treats limb jerks with a mail order herbal product retaining magnesium. 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 Much anxiety, gets tearful easily.  09/21/13- 53 yo F former smoker followed for obstructive sleep apnea complicated by history of depression, chronic back pain, GERD, obesity, DM., lung nodule ([facility]) .- PCP Dr Sol Passer FOLLOWS FOR: Wears CPAP 9/ [facility] every night for about 8 hours and if taking naps; Pressure works well for patient.   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 New problem: Choking/strangling sensation with swallowing. Food hangs at level of larynx or upper esophagus. Little reflux. Voice has not changed.  ROS-see HPI Constitutional:   No-   weight loss, night sweats, fevers, chills, fatigue, lassitude. HEENT:   No-   headaches, difficulty swallowing, tooth/dental problems, sore throat,       No-  sneezing, itching, ear ache, nasal congestion, post nasal drip,  CV:  No-   chest pain, orthopnea, PND, swelling in lower extremities, anasarca, dizziness, palpitations Resp: No- acute  shortness of breath with exertion or at rest.              No-   productive cough,  No non-productive cough,  No- coughing up of blood.              No-   change in color of mucus.  No- wheezing.   Skin: No-   rash or lesions. GI:  No-   heartburn, indigestion, abdominal pain, nausea, vomiting,  GU:  MS:  No-   joint pain or swelling.   Neuro-     nothing unusual Psych:  No- change in mood or affect. No depression or anxiety.  No memory loss.  OBJ General- Alert, Oriented, Affect-appropriate, Distress- none acute; obese. +cane Skin- rash-none, lesions- none, excoriation- none Lymphadenopathy- none Head- atraumatic            Eyes- Gross vision intact, PERRLA, conjunctivae clear secretions            Ears- Hearing, canals-normal            Nose- Clear, no-Septal dev, mucus, polyps, erosion, perforation             Throat- Mallampati III-IV , mucosa clear , drainage- none, tonsils- atrophic, dentures Neck- flexible , trachea midline, no stridor , thyroid nl, carotid no bruit  Chest - symmetrical excursion , unlabored           Heart/CV- RRR , no murmur , no gallop  , no rub, nl s1 s2                           - JVD- none , edema- none, stasis changes- none, varices- none           Lung- clear to P&A, wheeze- none, cough- none , dullness-none, rub- none           Chest wall-  Abd-  Br/ Gen/ Rectal- Not done, not indicated Extrem- cyanosis- none, clubbing, none, atrophy- none, strength- nl. Surgical absence left middle finger. Neuro- +Tremor

## 2013-09-22 ENCOUNTER — Telehealth: Payer: Self-pay | Admitting: Internal Medicine

## 2013-09-22 NOTE — Telephone Encounter (Signed)
I spoke with pt. She reports Dr. Sol Passer is wanting a report from Dr. Maple Hudson as to why he wants pt to have a barrium swallow done and also an order sent. She reports she told him what Dr. Maple Hudson stated on her check out sheet. When I asked pt if he agree'd for pt to have the test done the only thing she kept stating was "Dr. Sol Passer needs a paper from Dr. Maple Hudson". Please advise Dr. Maple Hudson thanks

## 2013-09-23 DIAGNOSIS — R1314 Dysphagia, pharyngoesophageal phase: Secondary | ICD-10-CM | POA: Insufficient documentation

## 2013-09-23 NOTE — Assessment & Plan Note (Signed)
She remains significantly overweight.

## 2013-09-23 NOTE — Assessment & Plan Note (Signed)
It sounds as if she may be describing esophageal dysmotility. From a pulmonary standpoint, we want to avoid aspiration. I suggested she have a modified barium swallow with speech therapy supervision for evaluation of dysphagia. We saw her primary physician might want to consider doing this test at [facility] if available, for her convenience and continuity. We can  schedule here if that would be better.

## 2013-09-23 NOTE — Assessment & Plan Note (Signed)
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

## 2013-09-23 NOTE — Assessment & Plan Note (Signed)
She describes good compliance and control. She is satisfied and uses it every time she sleeps. Denies significant daytime sleepiness.

## 2013-09-24 NOTE — Telephone Encounter (Signed)
I have sent her recent office visit note to Dr Sol Passer.

## 2013-09-26 ENCOUNTER — Telehealth: Payer: Self-pay | Admitting: Internal Medicine

## 2013-09-26 NOTE — Telephone Encounter (Signed)
I spoke with pt. I advised her Dr. Maple Hudson sent his last OV note to Dr. Sol Passer on 09/24/13. She also reports Dr. Maple Hudson wanted pt to continue on CPAP at 9 CM but reports this only goes to 5. I advised her she is referring to her ramp setting on her machine. I advised her according to Dr. Maple Hudson last note she is already set at 9 cm. Nothing further needed

## 2013-12-01 ENCOUNTER — Telehealth: Payer: Self-pay | Admitting: Internal Medicine

## 2013-12-01 DIAGNOSIS — G4733 Obstructive sleep apnea (adult) (pediatric): Secondary | ICD-10-CM

## 2013-12-01 NOTE — Telephone Encounter (Signed)
I called and spoke with pt. She received a letter from [facility]. They are requesting a current CPAP compliance download. They must received this 30 days from date 11/28/13. Address is : [place] [employer] [place] [phone]   Please advise Dr. Annamaria Boots thanks

## 2013-12-01 NOTE — Telephone Encounter (Signed)
Order was sent to [facility]  Spoke with the pt and notified that this was done

## 2013-12-01 NOTE — Telephone Encounter (Signed)
Please order CPAP compliance and control download from her DME, for [place] to send to [facility]   Dx OSA

## 2014-01-08 ENCOUNTER — Telehealth: Payer: Self-pay | Admitting: Internal Medicine

## 2014-01-08 NOTE — Telephone Encounter (Signed)
Fax received.  Dr. Annamaria Boots, will you pls review this and advise if we need to let pt know anything before we place copy at front for pt to pick up?  Thank you.

## 2014-01-08 NOTE — Telephone Encounter (Signed)
Per CY: report shows good compliance.  Ok to place copy at front for pt to pick up and scan copy into her chart.  Called, spoke with pt.  Informed her of above per CY.  She verbalized understanding of this and is aware copy is at front for pick up.  She voiced no further questions or concerns at this time.    Copy placed in CY's scan folder.

## 2014-01-08 NOTE — Telephone Encounter (Signed)
On 12/01/13, pt called stating DMV needed a cpap compliance report.  Order was faxed to [place] for:      Note    Please send order to [place] for CPAP compliance and control download thanks    ------  Called, spoke with pt.  Pt states she received another letter on Saturday stating if they didn't receive this report by this coming Friday, January 12, 2014, then they would take her licsence.  Pt reports [facility] did come out 3-4 wks ago and did the download.  I do not see thi scanned in pt's chart.  She would like to pick this up tomorrow to ensure DMV receives it before Friday.  Alverda Skeans, spoke with [facility].  She will fax this report to triage.  Will await fax.

## 2014-07-19 ENCOUNTER — Encounter: Payer: Self-pay | Admitting: *Deleted

## 2014-07-20 ENCOUNTER — Encounter: Payer: Self-pay | Admitting: Neurology

## 2014-07-20 ENCOUNTER — Ambulatory Visit (INDEPENDENT_AMBULATORY_CARE_PROVIDER_SITE_OTHER): Payer: Medicare Other | Admitting: Neurology

## 2014-07-20 ENCOUNTER — Encounter (INDEPENDENT_AMBULATORY_CARE_PROVIDER_SITE_OTHER): Payer: Self-pay

## 2014-07-20 VITALS — BP 107/67 | HR 93 | Ht 60.0 in | Wt 254.4 lb

## 2014-07-20 DIAGNOSIS — G629 Polyneuropathy, unspecified: Secondary | ICD-10-CM

## 2014-07-20 DIAGNOSIS — F919 Conduct disorder, unspecified: Secondary | ICD-10-CM

## 2014-07-20 DIAGNOSIS — F29 Unspecified psychosis not due to a substance or known physiological condition: Secondary | ICD-10-CM

## 2014-07-20 DIAGNOSIS — F09 Unspecified mental disorder due to known physiological condition: Secondary | ICD-10-CM

## 2014-07-20 DIAGNOSIS — G459 Transient cerebral ischemic attack, unspecified: Secondary | ICD-10-CM

## 2014-07-20 DIAGNOSIS — A6920 Lyme disease, unspecified: Secondary | ICD-10-CM

## 2014-07-20 DIAGNOSIS — G589 Mononeuropathy, unspecified: Secondary | ICD-10-CM

## 2014-07-20 DIAGNOSIS — G609 Hereditary and idiopathic neuropathy, unspecified: Secondary | ICD-10-CM

## 2014-07-20 DIAGNOSIS — R4689 Other symptoms and signs involving appearance and behavior: Secondary | ICD-10-CM

## 2014-07-20 DIAGNOSIS — R4189 Other symptoms and signs involving cognitive functions and awareness: Secondary | ICD-10-CM

## 2014-07-20 DIAGNOSIS — R51 Headache: Secondary | ICD-10-CM

## 2014-07-20 DIAGNOSIS — R410 Disorientation, unspecified: Secondary | ICD-10-CM

## 2014-07-20 DIAGNOSIS — R251 Tremor, unspecified: Secondary | ICD-10-CM

## 2014-07-20 DIAGNOSIS — R259 Unspecified abnormal involuntary movements: Secondary | ICD-10-CM

## 2014-07-20 NOTE — Patient Instructions (Addendum)
Overall you are doing fairly well but I do want to suggest a few things today:   Remember to drink plenty of fluid, eat healthy meals and do not skip any meals. Try to eat protein with a every meal and eat a healthy snack such as fruit or nuts in between meals. Try to keep a regular sleep-wake schedule and try to exercise daily, particularly in the form of walking, 20-30 minutes a day, if you can.   As far as your medications are concerned, I would like to suggest  As far as diagnostic testing: EEG, MRI of the brain and MRA of the brain vessels, Neuropsychiatric testing, labwork  I would like to see you back in 3 months, sooner if we need to. Please call us with any interim questions, concerns, problems, updates or refill requests.   Please also call us for any test results so we can go over those with you on the phone.  My clinical assistant and will answer any of your questions and relay your messages to me and also relay most of my messages to you.   Our phone number is [phone]. We also have an after hours call service for urgent matters and there is a physician on-call for urgent questions. For any emergencies you know to call 911 or go to the nearest emergency room

## 2014-07-20 NOTE — Progress Notes (Addendum)
[facility]    Provider:  Dr Jaynee Eagles Referring Provider: Teressa Lower, MD Primary Care Physician:  Teressa Lower, MD  CC:  Cognitive Dysfunction  HPI:  Chelsea [name] is a 68 y.o. female here as a referral from Dr. Garlon Hatchet for Cognitive Dysfunction. PMHx of morbid obesity, bipolar disorder,   Memory problems started acutely the beginning of the year when she was hospitalized for UTI 3x. She is following with urology.  She is forgetting "everything". She forget things she has done and going to do, she goes to the kitchen and forgets why she is there. She will be talking to someone and forgets who she is talking to, she can't remember which sister she is talking to, forgeting people's names; doesn't matter if it was 30 years or yesterday, she is forgetting everything. She doesn't get lost, still driving locally, husband does bills, no cleaning or cooking for a long time due to knee replacements and large body habitus. no personality changes or inappropriate behavior. She is under a lot of stress, has lost 2 sisters and her mother in the last few years and other siblings are sick. Symptoms are stable, not progressing - infact the last 3 days have been better. Mind has been clearer, was on prednisone because her sed rate "went crazy". Situation is putting a lot of stress on the husband, patient is falling and is frequently just sliding off of the sofa. Using a walker at home. Endorses significant depression and anxiety.Symptoms fluctuate throughout the day.   Patient is seeing things that aren't there, she knows they are not real. She is seeing children and her deceased sister. She sees them in the afternoons and night. Having hallucinations every time she looks out her windows. Seeing hallucinations in her house as well. She has episodes of confusion and is incontinent overnight, not sure if possibly having seizures. Also endorses staring episodes. Denies taking pain killers(per her  chart they cause hallucinations), no benzodiazepams, she does not take OTC meds except occ advil and vitamins; only takes what is on her medication list. Takes seroquel for Bipolar disorder. +FHx of dementia.  Wears a cpap at night. No changes in gait, no tremor. +dry mouth. No focal neurologic symptoms.  Reviewed notes, labs and imaging from outside physicians, which showed recent ldl 142, hga1c 6.4.Documentation by Dr. Garlon Hatchet her primary care believes symptoms are due to psychiatric problems in combination with medication effects. She has a complicated PMHx including HTN, CKD, liver enzyme dysfunction, depression, confusion, DM, dysphagia, SOB, HLD, OSA, RLS, osteoarthritis  Review of Systems: Patient complains of symptoms per HPI as well as the following symptoms weight loss, fatigue, easy bruising and bleedins, SOB, cough, snoring, feeling cold and hot, increased thirst, ringing in ears, spinning sensation, trouble swallowing, incontinence, constipation, joint pain, cramps, aching muscles, rash, moles, urination prob, incontinence, runny nose, skin sensitivity, frequent infections, memory loss, confusion, headache, insomnia, snoring, restlessleg, difficulty swallowing, dizziness, depression, anxiety, not enough sleep, OSA, disinterest in activities, hallucinations, racing thoughts. Pertinent negatives per HPI. Otherwise out of a complete 14 system review, and all other reviewed systems are negative.   History   Social History  . Marital Status: Single    Spouse Name: N/A    Number of Children: 2  . Years of Education: 12   Occupational History  . diabled    Social History Main Topics  . Smoking status: Former Smoker -- 1.00 packs/day for 9 years    Types: Cigarettes  Quit date: 11/09/2005  . Smokeless tobacco: Never Used     Comment: smokes 1 pack a month  . Alcohol Use: No     Comment: quit 17 years ago  . Drug Use: No  . Sexual Activity: Not on file   Other Topics Concern  .  Not on file   Social History Narrative   Patient is single with 2 children.   Patient is left handed.   Patient has high school education.   Patient drinks 1 cup daily.    Family History  Problem Relation Age of Onset  . Emphysema    . Allergies    . Heart disease    . Cancer    . Coronary artery disease Father   . Diabetes Father   . Diabetes Sister   . Dementia Mother   . Asthma Father   . Asthma Mother   . Asthma Sister   . Asthma Sister   . Asthma Brother   . Aneurysm Brother     Past Medical History  Diagnosis Date  . Tobacco user   . Chronic back pain   . GERD (gastroesophageal reflux disease)   . Obesity   . Vaginal cancer   . DM II (diabetes mellitus, type II), controlled 09/22/11  . Headache(784.0)     MIGRAINES  . Arthritis   . Rash     L ARM  . Recurrent UTI (urinary tract infection)   . OSA (obstructive sleep apnea)     USES C-PAP  . Depression   . Bipolar disorder   . Skin cancer     L HAND  . Complication of anesthesia     "I GOT TOO MUCH MEDICINE AND I DIED TWICE" (ANESTHESIA NOTES ON CHART)  . Other ankle sprain and strain   . Pain in joint, ankle and foot   . Exostosis of unspecified site   . Other chronic infective otitis externa   . Chronic mycotic otitis externa   . Other enthesopathy of ankle and tarsus   . Post-traumatic seroma   . Presbyacusis   . Unspecified pruritic disorder   . Subjective tinnitus   . Restless legs syndrome (RLS)   . Other psoriasis   . Secondary localized osteoarthrosis, lower leg   . Other dyspnea and respiratory abnormality   . Dysphagia, oropharyngeal phase   . Type II or unspecified type diabetes mellitus with renal manifestations, not stated as uncontrolled   . Chronic kidney disease, stage III (moderate)   . Unspecified psychosis   . Major depressive disorder, recurrent episode, unspecified   . Essential hypertension, benign   . Essential hypertension, benign   . Other nonspecific abnormal serum  enzyme levels   . Diabetes   . High cholesterol   . Migraine   . Depression   . Anxiety   . Cancer     Sarcoma    Past Surgical History  Procedure Laterality Date  . Nasal septum surgery    . Total abdominal hysterectomy  1970  . Bladder surgery      X 3   . Finger amputation      Left middle finger, cancer by pt report  . Total knee arthroplasty Right 03/14/2013    Procedure: RIGHT TOTAL KNEE ARTHROPLASTY;  Surgeon: Mauri Pole, MD;  Location: [facility];  Service: Orthopedics;  Laterality: Right;    Current Outpatient Prescriptions  Medication Sig Dispense Refill  . atorvastatin (LIPITOR) 20 MG tablet Take 1 tablet by mouth  daily before breakfast.       . beta carotene w/minerals (OCUVITE) tablet Take 1 tablet by mouth daily.        Marland Kitchen buPROPion (WELLBUTRIN SR) 200 MG 12 hr tablet Take 200 mg by mouth 2 (two) times daily.        . Clobetasol Propionate 0.05 % lotion       . Fluocinolone Acetonide (DERMA-SMOOTHE/FS SCALP) 0.01 % OIL Apply topically.      Marland Kitchen glucose blood test strip 1 each by Other route as needed for other. Use as instructed      . ketoconazole (NIZORAL) 2 % shampoo Apply 1 application topically 2 (two) times a week.      . Lancets Thin MISC by Does not apply route.      Marland Kitchen losartan (COZAAR) 25 MG tablet Take 25 mg by mouth daily before breakfast.       . Multiple Vitamin (MULTIVITAMIN) tablet Take 1 tablet by mouth daily.        . nitrofurantoin (MACRODANTIN) 100 MG capsule       . nystatin (MYCOSTATIN) 100000 UNIT/ML suspension       . nystatin cream (MYCOSTATIN) Apply topically.      Marland Kitchen OVER THE COUNTER MEDICATION Take 1 tablet by mouth daily. Mega life restless legs vitamin      . pramipexole (MIRAPEX) 0.5 MG tablet Take 0.5 mg by mouth 3 (three) times daily.      . QUEtiapine (SEROQUEL) 300 MG tablet Take 300 mg by mouth at bedtime.      Marland Kitchen QUEtiapine (SEROQUEL) 50 MG tablet Take 50 mg by mouth at bedtime.      . TRADJENTA 5 MG TABS tablet Take 1 tablet by  mouth daily.      Marland Kitchen trimethoprim (TRIMPEX) 100 MG tablet       . TRUEPLUS LANCETS 28G MISC by Does not apply route.      . venlafaxine XR (EFFEXOR-XR) 150 MG 24 hr capsule Take 150 mg by mouth at bedtime.      Marland Kitchen venlafaxine XR (EFFEXOR-XR) 75 MG 24 hr capsule Take 75 mg by mouth daily before breakfast.      . vitamin C (ASCORBIC ACID) 500 MG tablet Take by mouth.       No current facility-administered medications for this visit.    Allergies as of 07/20/2014 - Review Complete 07/20/2014  Allergen Reaction Noted  . Diazepam Other (See Comments)   . Oxycodone Other (See Comments) 07/19/2014  . Penicillins Rash   . Sulfonamide derivatives Rash     Vitals: BP 107/67  Pulse 93  Ht 5' (1.524 m)  Wt 254 lb 6.4 oz (115.395 kg)  BMI 49.68 kg/m2 Last Weight:  Wt Readings from Last 1 Encounters:  07/20/14 254 lb 6.4 oz (115.395 kg)   Last Height:   Ht Readings from Last 1 Encounters:  07/20/14 5' (1.524 m)    Physical exam: Exam: Gen: NAD, conversant Eyes: anicteric sclerae, moist conjunctivae HENT: Atraumatic, oropharynx clear Neck: Trachea midline; supple,  Lungs: CTA, no wheezing, rales, rhonic                          CV: RRR, no MRG Abdomen: Soft, non-tender; morbidly obese Skin: Normal temperature, no rash,  Psych: Appropriate affect, pleasant  Neuro: Detailed Neurologic Exam  Speech:    Speech is normal; fluent and spontaneous with normal comprehension.  Cognition:    The patient is oriented to  person, place, and time; MoCA 17/30 Cranial Nerves:    The pupils are equal, round, and reactive to light. The fundi are flat. Visual fields are full to finger confrontation. Extraocular movements are intact. Trigeminal sensation is intact and the muscles of mastication are normal. The face is symmetric. The palate elevates in the midline. Voice is normal. Shoulder shrug is normal. The tongue has normal motion without fasciculations.   Coordination:    Normal finger to nose  and heel to shin.  Gait:    Wide based due to large body habitus  Motor Observation:   Mild postural tremor Tone:    Normal muscle tone.  No cogwheeling.  Posture:    Posture is normal. normal erect    Strength:    Giveaway in the hip flexors otherwise strength is V/V in the upper and lower limbs.   Sensory: decreased to all modalities in the lower extremities.   Reflex Exam:  DTR's:    Hyporeflexic possibly due to large body habitus   Toes:    The toes are downgoing bilaterally.   Clonus:    Clonus is absent.      Assessment/Plan:  68 year old female with a PMHx of bipolar disorder, diabetes, morbid obesity, HLD, migraines, depression, anxiety who is here for 8 months of waxing and waning cognitive dysfunction in the setting of increased depression and stress, family health problems and several deaths, and frequent UTIs. Patient is forgeting "everything, whether it be a short term or a long term memory. She has episodes of confusion and also having hallucinations. She does not appear parkinsonian. Neuro exam non focal.  Cognitive dysfunction: 17/30 MoCA. Fluctuating cognition sounds like it is multifactorial, possibly secondary to medication effects, anxiety and stress, indolent infection (acute on chronic UTIs needing hospitalization), metabolic encephalopathy due to multiple chronic medical conditions and other psychiatric disease.   Will order an MRI of the brain/MRA of neck to rule out other causes such as stroke burden causing cognition changes, she has many vascular risk factors Neuropsych testing to try and distinguish vs pseudodementia EEG due to episodes of confusion, staring spells and incontinence during sleep Patient with falls and "sliding off sofa" so offered PT gait and safety, declined. Encouraged walking aids all the time to prevent falls. Has some decreased sensation distally and poor balance. Will order a neuropathy serum screen Patient's chart states she  has hallucinations when taking narcotics, will do a urine drug screen Given multiple risk factors suggested daily aspirin if not contraindicated, they will speak with their primary care because she seems to recall she shouldn't take aspirin but can't remember why Dementia serum panel. Couldn't order B12 as has already been done this year and insurance would not approve.   Addendum: Positive serology for Lyme. Lyme was ordered as part of the neuropathy panel [place] She has never been treated for Lyme disease. Below are lab results: IgG P93 Ab.  Absent   IgG P66 Ab.  Absent   IgG P58 Ab.  Absent   IgG P45 Ab.  Absent   IgG P41 Ab.  Present (A)   IgG P39 Ab.  Absent   IgG P30 Ab.  Absent   IgG P28 Ab.  Absent   IgG P23 Ab.  Absent   IgG P18 Ab.  Absent   Lyme IgG Wb  Negative   Comments: Positive: 5 of the following Borrelia-specific bands: 18,23,28,30,39,41,45,58, 66, and 93. Negative: No bands or banding patterns  which do not meet positive criteria. IgM P41 Ab.  Present (A)   IgM P39 Ab.  Present (A)   IgM P23 Ab.  Present (A)   Lyme IgM Wb  Positive (A)   Comments: Note: An equivocal or positive EIA result    Sarina Ill, MD  [facility] [address] [place], [place] [address]  Phone [phone] Fax [phone]

## 2014-07-21 DIAGNOSIS — G629 Polyneuropathy, unspecified: Secondary | ICD-10-CM | POA: Insufficient documentation

## 2014-07-21 LAB — DRUG SCREEN, URINE
Amphetamines, Urine: NEGATIVE ng/mL
Barbiturate screen, urine: NEGATIVE ng/mL
Benzodiazepine Quant, Ur: NEGATIVE ng/mL
Cannabinoid Quant, Ur: NEGATIVE ng/mL
Cocaine (Metab.): NEGATIVE ng/mL
Opiate Quant, Ur: NEGATIVE ng/mL
PCP Quant, Ur: NEGATIVE ng/mL

## 2014-07-25 NOTE — Addendum Note (Signed)
Addended by: Sarina Ill B on: 07/25/2014 07:44 PM   Modules accepted: Orders

## 2014-07-26 LAB — COMPREHENSIVE METABOLIC PANEL
ALT: 16 IU/L (ref 0–32)
AST: 14 IU/L (ref 0–40)
Albumin/Globulin Ratio: 1.5 (ref 1.1–2.5)
Albumin: 4.1 g/dL (ref 3.6–4.8)
Alkaline Phosphatase: 134 IU/L — ABNORMAL HIGH (ref 39–117)
BUN/Creatinine Ratio: 12 (ref 11–26)
BUN: 18 mg/dL (ref 8–27)
CO2: 22 mmol/L (ref 18–29)
Calcium: 10.1 mg/dL (ref 8.7–10.3)
Chloride: 100 mmol/L (ref 97–108)
Creatinine, Ser: 1.5 mg/dL — ABNORMAL HIGH (ref 0.57–1.00)
GFR calc Af Amer: 41 mL/min/{1.73_m2} — ABNORMAL LOW (ref >59)
GFR calc non Af Amer: 36 mL/min/{1.73_m2} — ABNORMAL LOW (ref >59)
Globulin, Total: 2.8 g/dL (ref 1.5–4.5)
Glucose: 115 mg/dL — ABNORMAL HIGH (ref 65–99)
Potassium: 4.2 mmol/L (ref 3.5–5.2)
Sodium: 144 mmol/L (ref 134–144)
Total Bilirubin: 0.6 mg/dL (ref 0.0–1.2)
Total Protein: 6.9 g/dL (ref 6.0–8.5)

## 2014-07-26 LAB — LYME, WESTERN BLOT, SERUM (REFLEXED)
IgG P18 Ab.: ABSENT
IgG P23 Ab.: ABSENT
IgG P28 Ab.: ABSENT
IgG P30 Ab.: ABSENT
IgG P39 Ab.: ABSENT
IgG P45 Ab.: ABSENT
IgG P58 Ab.: ABSENT
IgG P66 Ab.: ABSENT
IgG P93 Ab.: ABSENT
Lyme IgG Wb: NEGATIVE
Lyme IgM Wb: POSITIVE — AB

## 2014-07-26 LAB — PAN-ANCA
ANCA Proteinase 3: <3.5 U/mL (ref 0.0–3.5)
Atypical pANCA: <1:20 {titer}
C-ANCA: <1:20 {titer}
Myeloperoxidase Ab: <9 U/mL (ref 0.0–9.0)
P-ANCA: <1:20 {titer}

## 2014-07-26 LAB — IFE AND PE, SERUM
Albumin SerPl Elph-Mcnc: 3.5 g/dL (ref 3.2–5.6)
Albumin/Glob SerPl: 1.1 (ref 0.7–2.0)
Alpha 1: 0.2 g/dL (ref 0.1–0.4)
Alpha2 Glob SerPl Elph-Mcnc: 1.1 g/dL (ref 0.4–1.2)
B-Globulin SerPl Elph-Mcnc: 0.8 g/dL (ref 0.6–1.3)
Gamma Glob SerPl Elph-Mcnc: 1.4 g/dL (ref 0.5–1.6)
Globulin, Total: 3.4 g/dL (ref 2.0–4.5)
IgA/Immunoglobulin A, Serum: 239 mg/dL (ref 91–414)
IgG (Immunoglobin G), Serum: 865 mg/dL (ref 700–1600)
IgM (Immunoglobulin M), Srm: 581 mg/dL — ABNORMAL HIGH (ref 40–230)

## 2014-07-26 LAB — CRYOGLOBULIN, QL, SERUM, RFLX

## 2014-07-26 LAB — TSH: TSH: 2.07 u[IU]/mL (ref 0.450–4.500)

## 2014-07-26 LAB — METHYLMALONIC ACID, SERUM: Methylmalonic Acid: 157 nmol/L (ref 0–378)

## 2014-07-26 LAB — LYME, TOTAL AB TEST/REFLEX: Lyme IgG/IgM Ab: 1.04 {ISR} — ABNORMAL HIGH (ref 0.00–0.90)

## 2014-07-26 LAB — RPR: RPR: NONREACTIVE

## 2014-07-26 LAB — RHEUMATOID FACTOR: Rhuematoid fact SerPl-aCnc: 11.8 IU/mL (ref 0.0–13.9)

## 2014-07-26 LAB — ANA W/REFLEX: Anti Nuclear Antibody(ANA): NEGATIVE

## 2014-07-27 ENCOUNTER — Telehealth: Payer: Self-pay | Admitting: Neurology

## 2014-07-27 ENCOUNTER — Ambulatory Visit (INDEPENDENT_AMBULATORY_CARE_PROVIDER_SITE_OTHER): Payer: Medicare Other | Admitting: Radiology

## 2014-07-27 DIAGNOSIS — R4189 Other symptoms and signs involving cognitive functions and awareness: Secondary | ICD-10-CM

## 2014-07-27 DIAGNOSIS — R4689 Other symptoms and signs involving appearance and behavior: Principal | ICD-10-CM

## 2014-07-27 DIAGNOSIS — F919 Conduct disorder, unspecified: Secondary | ICD-10-CM

## 2014-07-27 DIAGNOSIS — R410 Disorientation, unspecified: Secondary | ICD-10-CM

## 2014-07-27 NOTE — Telephone Encounter (Signed)
Called and was placed on hold.  Will call back.

## 2014-07-27 NOTE — Telephone Encounter (Signed)
Spoke to April and relayed patients complaints, why patient was being seen.

## 2014-07-27 NOTE — Procedures (Signed)
      Chelsea [name] is a 68 year old patient with a history of a bipolar disorder, migraine headaches, and a history of progressive cognitive dysfunction. The patient is having short-term memory issues, and difficulty with concentration. She is being evaluated for this issue.  This is a routine EEG. No skull defects are noted. Medications include Lipitor Wellbutrin, multivitamins, Cozaar, Mirapex, Seroquel, Tradjenta, Effexor, and vitamin C.  EEG classification: Dysrhythmia grade 1 generalized  Description of the recording: The background rhythms of this recording consists of a moderately well modulated medium amplitude 7 Hz background theta activity. This background slowing is seen throughout the entirety of the recording. Photic stimulation and hyperventilation were not performed. At no time during the recording does there appear to be evidence of spike or spike or discharges or evidence of focal slowing. The patient does not clearly enter stage II sleep at any time. The EKG monitor shows no evidence of cardiac rhythm abnormalities with a heart rate of 84.  Impression: This is an abnormal EEG recording secondary to diffuse theta frequency background slowing. This is a nonspecific finding, and can be seen with any process that results in a metabolic or toxic encephalopathy, or any dementing type illness. No epileptiform discharges are seen.

## 2014-07-27 NOTE — Telephone Encounter (Signed)
April from [facility] calling to get more information regarding patient's postive Lyme disease test, wants to know if she had the rash, if had a tick, and if she was placed on medication for it. Please return call and advise.

## 2014-07-31 ENCOUNTER — Telehealth: Payer: Self-pay | Admitting: Neurology

## 2014-07-31 NOTE — Telephone Encounter (Signed)
Spoke with patient regarding EG results. No one has contacted them yet regarding the infectious disease consult. Would you look into that please? Thank you.

## 2014-08-01 ENCOUNTER — Ambulatory Visit
Admission: RE | Admit: 2014-08-01 | Discharge: 2014-08-01 | Disposition: A | Discharge status: Home / Self Care | Payer: Medicare Other | Source: Ambulatory Visit | Attending: Neurology | Admitting: Neurology

## 2014-08-01 DIAGNOSIS — R4189 Other symptoms and signs involving cognitive functions and awareness: Secondary | ICD-10-CM

## 2014-08-01 DIAGNOSIS — G459 Transient cerebral ischemic attack, unspecified: Secondary | ICD-10-CM

## 2014-08-01 DIAGNOSIS — R51 Headache: Secondary | ICD-10-CM

## 2014-08-01 DIAGNOSIS — R4689 Other symptoms and signs involving appearance and behavior: Secondary | ICD-10-CM

## 2014-08-02 ENCOUNTER — Telehealth: Payer: Self-pay

## 2014-08-02 NOTE — Telephone Encounter (Signed)
Message copied by Milta Deiters on Thu Aug 02, 2014  9:17 AM ------      Message from: AHERN, ANTONIA B      Created: Wed Jul 25, 2014  7:46 PM       Spoke to patient and am referring her to infectious disease for positive lyme serology. Can we see if there is an infectious disease doctor close to their home? They live 30 miles away. If not they will have to come to [facility] to see them. Thanks. ------

## 2014-08-02 NOTE — Telephone Encounter (Signed)
Spoke to patient. She lives closer to [place] but there aren't any ID doctors in the area, so she does not mind traveling to [place]. Advised would check on referral status, and call her back later with details. Patient agreed.

## 2014-08-02 NOTE — Telephone Encounter (Signed)
Spoke to [employer] at [employer]. Waiting on MD to review referral, will contact patient if ok to schedule. Will notify our office if not.

## 2014-08-03 ENCOUNTER — Telehealth: Payer: Self-pay | Admitting: Neurology

## 2014-08-03 NOTE — Telephone Encounter (Signed)
I sent the referral to the [facility] for Infectious Disease. Their office contacted the patient. I called the [facility] to get the referral info for patient today, but the office was closed. So, I called the patient, spoke to the spouse. Provided [facility] contact info to call on Monday 08/06/14. Spouse agreed.

## 2014-08-03 NOTE — Telephone Encounter (Signed)
Poke to patient's husband and explained MRi results. Thanks.

## 2014-08-03 NOTE — Telephone Encounter (Signed)
Chelsea [name] - Any way you can follow up on the infectious disease referral and see who called them? Patient is confused and someone called but she didn't get their phone number. If you can find out who we sent the Infectious Disease referral to and call back and speak to the husband and give him the information I would be so appreciative. Thank yoU!

## 2014-08-06 NOTE — Telephone Encounter (Signed)
Dr. Graylon Good calling to speak with Dr. Jaynee Eagles regarding patient, please return call at [phone].

## 2014-08-06 NOTE — Telephone Encounter (Signed)
Left 2nd message to get called back to discuss results. Clinical history does not appear to be consistent with acute lyme disease in speaking with the patient. Will call to discuss next options, including following up.

## 2014-08-07 NOTE — Telephone Encounter (Signed)
Hi Dr. Baxter Flattery - I called you and left a message on voicemail at [phone] when you called the first time. I left a message with my cell phone so you could call me directly. Sorry you didn't get it or maybe I missed your second call. In any case, I'm very sorry and my cell phone is [phone]. I will try you in the morning. Thank you!

## 2014-08-08 ENCOUNTER — Telehealth: Payer: Self-pay | Admitting: Internal Medicine

## 2014-08-08 NOTE — Telephone Encounter (Signed)
Spoke with Dr. Garlon Hatchet who will see them in 7-10 days to follow up on lyme testing. Patient has multiple medical problems, but not having any fevers, rash. She is predominantly housebound. Risk factor for tick bites very low. i suspect igM lyme disease false positive. Patient had recent course of doxycycline treatment in May 2015 per Dr. Garlon Hatchet. I will recommend to retest to see if need to treat. Clinical history at this time does not seem consistent with lyme disease.

## 2014-08-08 NOTE — Telephone Encounter (Signed)
Spoke to Dr. Baxter Flattery this morning. She is not concerned for acute or chronic lyme disease or neuro lyme. Thanks.

## 2014-08-20 NOTE — Telephone Encounter (Signed)
Spoke to spouse and patient. Gave results. Spouse says Dr. Garlon Hatchet spoke with Dr. Baxter Flattery also,  put patient on Doxycycline as part of treatment of symptoms.

## 2014-08-31 ENCOUNTER — Telehealth: Payer: Self-pay | Admitting: Neurology

## 2014-08-31 NOTE — Telephone Encounter (Signed)
Spoke to patient's boyfriend today. Confusion is worsening.  I think a lumbar puncture and additional labs should be performed. Boyfriend and patient agree. However they do not want to travel to [place] and [place] not near a [facility]. They prefer I speak with Dr. Garlon Hatchet and ask him to order any additional testing so they are closer to their house. I will speak to Dr. Garlon Hatchet and suggest the following:  Serum paraneoplastic panel.   Lumbar puncture for CSF:  Protein, glucose, cell count and diff, cultures (bacterial, fungal and AFB), HSV PCR,  Lyme PCR, VDRL, Crypto Ag, Histoplasma Ab, EBV PCR, Herpes Virus 6 IgM, cytology, West Nile Ab, Eterovirus PCR, TB PCR May also include in CSF:  oligoclonal bands, IgG index, and synthesis rate  (to evaluate for inflammatory changes that may indicate an autoimmune encephalopathy),  Called Dr. Arna Medici office and he is out this week. I will forward to him. Thank you.  Georgia Dom, MD

## 2014-09-05 NOTE — Telephone Encounter (Signed)
Spoke to her dr, dr Garlon Hatchet. He is going to order CSF studies. thanks

## 2014-09-10 ENCOUNTER — Telehealth: Payer: Self-pay | Admitting: Neurology

## 2014-09-10 NOTE — Telephone Encounter (Signed)
Patient's boyfriend Mallie Mussel calling to state that patient has been experiencing some bad stomach problems this morning and wants to know if patient should still have her LP done tomorrow, please return call and advise.

## 2014-09-10 NOTE — Telephone Encounter (Signed)
Patient's boyfriend Mallie Mussel called back and stated Psychiatrist felt Lumbar Puncture should be administered at hospital for a 24 hour stay.  Felt patient's boyfriend wouldn't be able to keep patient in bed after procedure.  Would like to have this done within a few days, preferably end of week.  Please return call @ [phone] and advise.

## 2014-09-10 NOTE — Telephone Encounter (Signed)
Spoke to patient's husband. If she is ill I think the LP should be rescheduled however think we need it as soon as possible given her quickly progressive dementia. I don't think she needs to be hospitalized for 24 hours though to get the LP. I spoke to Dr. Garlon Hatchet, very much appreciate his guidance. I faxed him the MRI results of the brain from 08/01/2014. He is going to call the facility in [place] and reschedule the LP. Patient doesn't want to come to [facility] for LP. Husband aware of the importance of the lumbar puncture.

## 2014-09-11 ENCOUNTER — Telehealth: Payer: Self-pay | Admitting: Neurology

## 2014-09-11 NOTE — Telephone Encounter (Signed)
Dr. Missy Sabins calling to speak with Dr. Jaynee Eagles to coordinate care regarding patient, wants to discuss Namenda as a possible medication for patient, but needs more information, please return her call.

## 2014-09-11 NOTE — Telephone Encounter (Signed)
Spoke to psychiatrist who has been following patient for years, Dr. Missy Sabins. She says patient's memory loss is progressing very quickly, she had some mild memory problems in march but recently patient didn't even remember psychiatrist's name. Changes started in March and progressing rapidly with increased hallucinations and delusions. I completely agree, we have ordered a lumbar puncture and have been trying to get patient to have the procedure. Patient and her boyfriend did not want to drive back to [facility] so have been discussing with Dr. Garlon Hatchet and he has arranged LP in [place]. However boyfriend feels now that she should be admitted to the hospital because he is concerned that he cannot keep her laying flat for 24 hours after the LP procedure. I did discuss with boyfriend that this is more to prevent headaches and is not mandatory. I think boyfriend is just overwhelmed.  Will discuss with Dr. Garlon Hatchet.  I have previously discussed lumbar puncture with Dr. Garlon Hatchet. Below are all the tests I have requested. Will route this to Carlyle Basques, MD in infectious disease to see if she has any more suggestions for csf studies. Very much appreciate her recommendations.  Suggested tests: Serum:paraneoplastic panel.   CSF: Protein, glucose, cell count and diff, cultures (bacterial, fungal and AFB), HSV PCR, Lyme PCR, VDRL, Crypto Ag, Histoplasma Ab, EBV PCR, Herpes Virus 6 IgM, cytology, West Nile Ab, Eterovirus PCR, TB PCR May also include in CSF: oligoclonal bands, IgG index, and synthesis rate (to evaluate for inflammatory changes that may indicate an autoimmune encephalopathy),

## 2014-09-12 NOTE — Telephone Encounter (Signed)
Patient's boyfriend is calling because patient is getting worse. Patient's boyfriend stated that the Lumbar Puncture will be rescheduled and patient has an appointment with Dr. Garlon Hatchet tomorrow.

## 2014-09-13 NOTE — Telephone Encounter (Signed)
Dr. Arna Medici office called and wants Dr. Jaynee Eagles to return his call regarding patient. Dr. Garlon Hatchet can be reached at [phone]. Thank you.

## 2014-09-13 NOTE — Telephone Encounter (Signed)
Dr. Missy Sabins calling to state that she does not agree with Dr. Arna Medici diagnosis about patient being manic, please return her call at [phone].

## 2014-09-14 ENCOUNTER — Telehealth: Payer: Self-pay | Admitting: Neurology

## 2014-09-14 NOTE — Telephone Encounter (Signed)
Spoke to Dr. Missy Sabins, she feels this is more confusion and not mania, she does not agree with Dr. Arna Medici diagnosis. Spoke to Patient's boyfriend who feels it is psychiatric as well. Patient says she feels depressed too. Told patient my suggestion is to call and reschedule LP to ensure we are not missing another diagnosis. If patient becomes very altered he should take her to the ED.

## 2014-09-19 ENCOUNTER — Telehealth: Payer: Self-pay | Admitting: Neurology

## 2014-09-19 NOTE — Telephone Encounter (Signed)
Returned Dr. Arna Medici call this afternoon. He was aout of the office. Spoke to his PA. Will try back in the morning. Spoke to patient. She was having increased hallucinations. She was talking to her mother and 2 sisters who passed. She was admitted to [facility]. She was escorted by police. She will be having the lumbar puncture completed in the hospital, needs a neurology consult there if possible. She was sent to [facility]. She is admitted. Dr. Missy Sabins and Dr. Garlon Hatchet both have my suggested csf and serum workup.

## 2014-09-19 NOTE — Telephone Encounter (Signed)
Patient's boyfriend stated patient admitted to hospital 09/20/14 @ [facility].  Dr. Diana Eves is admitting psychiatrist.  Juluis Rainier

## 2014-09-19 NOTE — Telephone Encounter (Signed)
Spoke to boyfriend to let him know message received.

## 2014-09-21 ENCOUNTER — Ambulatory Visit: Payer: Medicare Other | Admitting: Internal Medicine

## 2014-09-26 ENCOUNTER — Telehealth: Payer: Self-pay | Admitting: Neurology

## 2014-09-26 NOTE — Telephone Encounter (Signed)
Chelsea [name] just wanted Dr. Jaynee Eagles to know that Chelsea [name] is having a lumbar puncture today.  She is still in the hospital but doing fine.

## 2014-09-26 NOTE — Telephone Encounter (Signed)
Patient is in [facility], in [place] on [address]. She is not doing any better. She is being given anti-psychotics. She is having a lumbar puncture today.

## 2014-10-02 ENCOUNTER — Telehealth: Payer: Self-pay | Admitting: Neurology

## 2014-10-02 NOTE — Telephone Encounter (Signed)
Called Dr. Darla Lesches follow up on Ms Triplett who is hospitalized for altered mental status, possible manic episode. She had a lumbar puncture to rule out other etiologies of her progressive cognitive and behavioral changes. Left my cell phone.

## 2014-10-03 NOTE — Telephone Encounter (Signed)
Spoke to Dr. Missy Sabins who did not know results of LP yet. But Patient is being followed by inpatient Neurology service who is managing her neurologic needs currently. Patient can follow back up with me as needed after discharge.

## 2014-10-19 ENCOUNTER — Ambulatory Visit: Payer: Medicare Other | Admitting: Neurology

## 2014-11-15 ENCOUNTER — Ambulatory Visit: Payer: Medicare Other | Admitting: Internal Medicine

## 2015-04-19 ENCOUNTER — Ambulatory Visit (INDEPENDENT_AMBULATORY_CARE_PROVIDER_SITE_OTHER): Payer: Medicare Other | Admitting: Internal Medicine

## 2015-04-19 ENCOUNTER — Ambulatory Visit: Payer: Medicare Other | Admitting: Internal Medicine

## 2015-04-19 ENCOUNTER — Encounter: Payer: Self-pay | Admitting: Internal Medicine

## 2015-04-19 VITALS — BP 126/72 | HR 83 | Ht 61.0 in | Wt 227.0 lb

## 2015-04-19 DIAGNOSIS — R911 Solitary pulmonary nodule: Secondary | ICD-10-CM

## 2015-04-19 DIAGNOSIS — G4733 Obstructive sleep apnea (adult) (pediatric): Secondary | ICD-10-CM | POA: Diagnosis not present

## 2015-04-19 DIAGNOSIS — E119 Type 2 diabetes mellitus without complications: Secondary | ICD-10-CM

## 2015-04-19 NOTE — Patient Instructions (Addendum)
Order- DME [facility]- replacement for worn out CPAP machine, 9 cwp, mask of choice, humidifier supplies   Dx OSA  Order- CPAP download for pressure compliance  We will fill out DOT form and fax it to Dr Garlon Hatchet  Please call as needed

## 2015-04-19 NOTE — Progress Notes (Addendum)
09/22/11- 69 yo F former smoker followed for obstructive sleep apnea complicated by history of depression, chronic back pain, GERD, obesity, DM.- PCP Dr Kalman Drape Last here 09/23/2010 Since last here she has been diagnosed with diabetes. Has had flu vaccine. 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 She went back to using her old CPAP with a bad switch, set at 28 / [place]. She does use it all night every night and during naps is comfortable with it. Being followed at [facility] for a nodule on right upper lobe, every 3 months with no biopsy.  09/21/12-69 yo F former smoker followed for obstructive sleep apnea complicated by history of depression, chronic back pain, GERD, obesity, DM., lung nodule ([facility]) .- PCP Dr Garlon Hatchet Wears CPAP 9/[employer] every night and during the day if taking a nap. Pressure working well for patient. Straps rub her skin tags. She treats limb jerks with a mail order herbal product retaining magnesium. 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 Much anxiety, gets tearful easily.  09/21/13- 69 yo F former smoker followed for obstructive sleep apnea complicated by history of depression, chronic back pain, GERD, obesity, DM., lung nodule ([facility]) .- PCP Dr Garlon Hatchet FOLLOWS FOR: Wears CPAP 9/ [employer] every night for about 8 hours and if taking naps; Pressure works well for patient.   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 New problem: Choking/strangling sensation with swallowing. Food hangs at level of larynx or upper esophagus. Little reflux. Voice has not changed.  04/19/15- 69 yo F former smoker followed for obstructive sleep apnea complicated by history of bipolar, chronic back pain,  GERD, obesity, DM2., lung nodule ([facility]) .- PCP Dr Garlon Hatchet     Female friend here NPSG 01/15/98 AHI 16/ hr, weight 220 lbs MSLT 02/28/98 5/0 CPAP 9/ [facility] Reports: CPAP maching is in need of repair it has a gurgling sound, CPAP worn nightly  about 6-8 hrs /anytime she sleeps.  Problem with recurrent urinary infections resulting in several hospital stays. She asks we complete DOT form for her sleep apnea, which will require download confirms CPAP compliance.  ROS-see HPI Constitutional:   No-   weight loss, night sweats, fevers, chills, fatigue, lassitude. HEENT:   No-  headaches, difficulty swallowing, tooth/dental problems, sore throat,       No-  sneezing, itching, ear ache, nasal congestion, post nasal drip,  CV:  No-   chest pain, orthopnea, PND, swelling in lower extremities, anasarca, dizziness, palpitations Resp: No- acute  shortness of breath with exertion or at rest.              No-   productive cough,  No non-productive cough,  No- coughing up of blood.              No-   change in color of mucus.  No- wheezing.   Skin: No-   rash or lesions. GI:  No-   heartburn, indigestion, abdominal pain, nausea, vomiting,  GU:  MS:  No-   joint pain or swelling.   Neuro-     nothing unusual Psych:  No- change in mood or affect. No depression or anxiety.  No memory loss.  OBJ General- Alert, Oriented, Affect-appropriate, Distress- none  acute; obese. +cane Skin- rash-none, lesions- none, excoriation- none Lymphadenopathy- none Head- atraumatic            Eyes- Gross vision intact, PERRLA, conjunctivae clear secretions            Ears- Hearing, canals-normal            Nose- Clear, no-Septal dev, mucus, polyps, erosion, perforation             Throat- Mallampati III-IV , mucosa clear , drainage- none, tonsils- atrophic,                           +dentures Neck- flexible , trachea midline, no stridor , thyroid nl, carotid no bruit Chest - symmetrical excursion , unlabored           Heart/CV- RRR  , no murmur , no gallop  , no rub, nl s1 s2                           - JVD- none , edema- none, stasis changes- none, varices- none           Lung- clear to P&A, wheeze- none, cough- none , dullness-none, rub- none           Chest wall-  Abd-  Br/ Gen/ Rectal- Not done, not indicated Extrem- cyanosis- none, clubbing, none, atrophy- none, strength- nl. Surgical absence left middle finger. Neuro- +Tremor

## 2015-04-20 NOTE — Assessment & Plan Note (Signed)
She reports ongoing follow-up for this problem at [facility]. Have no documentation currently.

## 2015-04-20 NOTE — Addendum Note (Signed)
Addended by: Baird Lyons D on: 04/20/2015 08:12 PM   Modules accepted: Miquel Dunn

## 2015-04-20 NOTE — Assessment & Plan Note (Signed)
She describes great compliance and control. We will request download. Her machine is worn out and we will request replacement.

## 2015-04-23 ENCOUNTER — Telehealth: Payer: Self-pay | Admitting: Internal Medicine

## 2015-04-23 NOTE — Telephone Encounter (Signed)
These forms are not yet scanned into pt's chart.   Per Joellen Jersey the [address] scanning center needs to be called to have these sent back to [place]. Joellen Jersey states she has the number to this location. Katie please advise on number.  Thanks!

## 2015-04-23 NOTE — Telephone Encounter (Signed)
Chelsea Jersey do you still have this patient's paperwork as it is not in the chart yet? thanks

## 2015-04-23 NOTE — Telephone Encounter (Signed)
Information was faxed and then sent to scan.

## 2015-04-25 NOTE — Telephone Encounter (Signed)
[address] scanning number is [phone]

## 2015-04-25 NOTE — Telephone Encounter (Signed)
atc [address] scanning # provided below X2, line rang with no answer for 2 minutes and no option to leave vm or enter any extension.  Wcb.

## 2015-04-26 NOTE — Telephone Encounter (Signed)
Attempted to call [employer], phone rang several times, then went to busy signal. Will attempt to call back.

## 2015-04-29 NOTE — Telephone Encounter (Signed)
ATC - line rang several time, then went to busy signal. [facility]

## 2015-04-30 NOTE — Telephone Encounter (Signed)
Chelsea [name] from scan center called back. They have not received document.

## 2015-04-30 NOTE — Telephone Encounter (Signed)
Pt returned call - [phone]

## 2015-04-30 NOTE — Telephone Encounter (Signed)
I called Psychologist, educational number listed below-no answer; called Trish in HIM-was given new phone number for Scanning dept since they are now located on [place], [place]. New phone number of [phone]. Was told that they would look for [facility] papers on patient and call me back.    I spoke with patient-she is aware that I am working with our scanning dept to get the [facility] papers scanned asap or faxed back to me. I will fax all pages again to [facility] once received. Pt aware I will contact her once completed.

## 2015-04-30 NOTE — Telephone Encounter (Signed)
LM for pt to let her know that we still have not gotten in touch with scan center to get DMV forms back -they are not scanned in chart as of today either.  ATC scan center- range several times then went to busy signal. [facility]

## 2015-04-30 NOTE — Telephone Encounter (Signed)
Katie please advise. Thanks. 

## 2015-04-30 NOTE — Telephone Encounter (Signed)
Pt called back stating that she called the DMV and they received all papers in the packet and nothing more needed at this time.

## 2015-04-30 NOTE — Telephone Encounter (Signed)
Pt wants Joellen Jersey to call her back about this - [phone]

## 2016-04-17 ENCOUNTER — Encounter: Payer: Self-pay | Admitting: Internal Medicine

## 2016-04-17 ENCOUNTER — Ambulatory Visit (INDEPENDENT_AMBULATORY_CARE_PROVIDER_SITE_OTHER): Payer: Medicare HMO | Admitting: Internal Medicine

## 2016-04-17 VITALS — BP 124/82 | HR 89 | Ht 61.0 in | Wt 266.0 lb

## 2016-04-17 DIAGNOSIS — G4733 Obstructive sleep apnea (adult) (pediatric): Secondary | ICD-10-CM

## 2016-04-17 DIAGNOSIS — R0609 Other forms of dyspnea: Secondary | ICD-10-CM | POA: Diagnosis not present

## 2016-04-17 DIAGNOSIS — R0600 Dyspnea, unspecified: Secondary | ICD-10-CM

## 2016-04-17 NOTE — Patient Instructions (Signed)
The more you can walk and watch what you eat so you lose some weight, the easier your breathing will get.  We can continue CPAP 9/ [place] Spirometry      Dx dyspnea on exertion

## 2016-04-17 NOTE — Assessment & Plan Note (Signed)
Significant obesity. She clearly avoids exercise. She does not indicate any interest in trying to change lifestyle to permit weight loss. Not sure that we could get her qualified for pulmonary rehabilitation but she pretty much said she wouldn't be interested.

## 2016-04-17 NOTE — Progress Notes (Signed)
09/22/11- 70 yo F former smoker followed for obstructive sleep apnea complicated by history of depression, chronic back pain, GERD, obesity, DM.- PCP Dr Kalman Drape Last here 09/23/2010 Since last here she has been diagnosed with diabetes. Has had flu vaccine. 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 She went back to using her old CPAP with a bad switch, set at 75 / [place]. She does use it all night every night and during naps is comfortable with it. Being followed at [facility] for a nodule on right upper lobe, every 3 months with no biopsy.  09/21/12-66 yo F former smoker followed for obstructive sleep apnea complicated by history of depression, chronic back pain, GERD, obesity, DM., lung nodule ([facility]) .- PCP Dr Garlon Hatchet Wears CPAP 9/[employer] every night and during the day if taking a nap. Pressure working well for patient. Straps rub her skin tags. She treats limb jerks with a mail order herbal product retaining magnesium. 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 Much anxiety, gets tearful easily.  09/21/13- 15 yo F former smoker followed for obstructive sleep apnea complicated by history of depression, chronic back pain, GERD, obesity, DM., lung nodule ([facility]) .- PCP Dr Garlon Hatchet FOLLOWS FOR: Wears CPAP 9/ [employer] every night for about 8 hours and if taking naps; Pressure works well for patient.   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 New problem: Choking/strangling sensation with swallowing. Food hangs at level of larynx or upper esophagus. Little reflux. Voice has not changed.  04/19/15- 70 yo F former smoker followed for obstructive sleep apnea complicated by history of bipolar, chronic back pain,  GERD, obesity, DM2., lung nodule ([facility]) .- PCP Dr Garlon Hatchet     Female friend here NPSG 01/15/98 AHI 16/ hr, weight 220 lbs MSLT 02/28/98 5/0 CPAP 9/ [facility] Reports: CPAP maching is in need of repair it has a gurgling sound, CPAP worn nightly  about 6-8 hrs /anytime she sleeps.  Problem with recurrent urinary infections resulting in several hospital stays. She asks we complete DOT form for her sleep apnea, which will require download confirms CPAP compliance.  04/17/2016-70 year old female former smoker followed for OSA, Complicated by history bipolar, chronic back pain, GERD, obesity CPAP 9/[employer] FOLLOWS FOR: Wears CPAP every night for about 8-9 hours; DME [employer] Download shows good compliance. Complains that she gets short of breath with exertion so she doesn't try to exercise. Comfortable at rest. Comfortable when wearing CPAP. Office Spirometry 04/17/2016-moderate restriction of exhaled volume-FVC 1.47/55%, FEV1 1.21/60%, FEV1/FVC 0.82, FEF 25-75 percent 1.16.  ROS-see HPI Constitutional:   No-   weight loss, night sweats, fevers, chills, fatigue, lassitude. HEENT:   No-  headaches, difficulty swallowing, tooth/dental problems, sore throat,       No-  sneezing, itching, ear ache, nasal congestion, post nasal drip,  CV:  No-   chest pain, orthopnea, PND, swelling in lower extremities, anasarca, dizziness, palpitations Resp: +  shortness of breath with exertion or at rest.              No-   productive cough,  No non-productive cough,  No- coughing up of blood.              No-   change  in color of mucus.  No- wheezing.   Skin: No-   rash or lesions. GI:  No-   heartburn, indigestion, abdominal pain, nausea, vomiting,  GU:  MS:  No-   joint pain or swelling.   Neuro-     nothing unusual Psych:  No- change in mood or affect. No depression or anxiety.  No memory loss.  OBJ General- Alert, Oriented, Affect-appropriate, Distress- none acute; + morbidly obese. +cane Skin- rash-none, lesions- none,  excoriation- none Lymphadenopathy- none Head- atraumatic            Eyes- Gross vision intact, PERRLA, conjunctivae clear secretions            Ears- Hearing, canals-normal            Nose- Clear, no-Septal dev, mucus, polyps, erosion, perforation             Throat- Mallampati III-IV , mucosa clear , drainage- none, tonsils- atrophic,                                                 +dentures Neck- flexible , trachea midline, no stridor , thyroid nl, carotid no bruit Chest - symmetrical excursion , unlabored           Heart/CV- RRR , no murmur , no gallop  , no rub, nl s1 s2                           - JVD- none , edema- none, stasis changes- none, varices- none           Lung- clear to P&A, wheeze- none, cough- none , dullness-none, rub- none           Chest wall-  Abd-  Br/ Gen/ Rectal- Not done, not indicated Extrem- cyanosis- none, clubbing, none, atrophy- none, strength- nl. Surgical absence left middle finger. Neuro- +Tremor

## 2016-04-17 NOTE — Assessment & Plan Note (Signed)
CPAP compliance is good. Friend with her denies breakthrough snoring. Primary problem is her morbid obesity.

## 2016-05-04 ENCOUNTER — Encounter: Payer: Self-pay | Admitting: Internal Medicine

## 2016-06-01 ENCOUNTER — Telehealth: Payer: Self-pay | Admitting: Internal Medicine

## 2016-06-01 NOTE — Telephone Encounter (Signed)
Spoke with pt.  She mailed a form from the [facility] to our office last week for Dr Annamaria Boots to fill out.  She is checking on this form.  Katie, have you seen this form?

## 2016-06-02 NOTE — Telephone Encounter (Signed)
Pt is aware that forms have been completed and requests forms with CPAP download be mailed to her home address(pt provided stamped envelope to mail to her home address). Copy has been made for our records. Nothing more needed at this time.

## 2016-07-29 ENCOUNTER — Telehealth: Payer: Self-pay | Admitting: Internal Medicine

## 2016-07-29 DIAGNOSIS — G4733 Obstructive sleep apnea (adult) (pediatric): Secondary | ICD-10-CM

## 2016-07-29 NOTE — Telephone Encounter (Signed)
Spoke with pt, states her cpap is very old and buttons are falling off of it, would like for a new cpap to be ordered.  DME: [facility].    CY please advise if ok to order.  Thanks!

## 2016-07-29 NOTE — Telephone Encounter (Signed)
Pt aware and voiced understanding of CY recommendation. Ordered has been placed. Nothing further needed.

## 2016-07-29 NOTE — Telephone Encounter (Signed)
Ok to order DME [employer] replacement for old CPAP machine  Auto 5-15, mask of choice, humidifier, supplies, humidifier, AirView   Dx OSA

## 2017-04-21 ENCOUNTER — Telehealth: Payer: Self-pay | Admitting: Internal Medicine

## 2017-04-21 NOTE — Telephone Encounter (Signed)
Spoke with patient and asked her to bring CPAP machine with cord or SD card to appointment. Pt stated she will bring machine with cord. Nothing further is needed.

## 2017-04-22 ENCOUNTER — Ambulatory Visit (INDEPENDENT_AMBULATORY_CARE_PROVIDER_SITE_OTHER): Payer: Medicare HMO | Admitting: Internal Medicine

## 2017-04-22 ENCOUNTER — Encounter: Payer: Self-pay | Admitting: Internal Medicine

## 2017-04-22 DIAGNOSIS — G4733 Obstructive sleep apnea (adult) (pediatric): Secondary | ICD-10-CM | POA: Diagnosis not present

## 2017-04-22 NOTE — Patient Instructions (Addendum)
We can continue CPAP 9/ [employer], mask of choice, humidifier, supplies, AirView   Dx OSA  I agree with your effports to lose more weight. It will help all of your medical problems and you will feel better.  Please call as needed

## 2017-04-22 NOTE — Progress Notes (Signed)
HPI female former smoker followed for OSA, Complicated by history bipolar, chronic back pain, GERD, obesity NPSG 01/15/98 AHI 16/ hr, weight 220 lbs MSLT 02/28/98 5/0 Office Spirometry 04/17/2016-moderate restriction of exhaled volume-FVC 1.47/55%, FEV1 1.21/60%, FEV1/FVC 0.82, FEF 25-75% 1.16. --------------------------------------------------------------------------------------------------  04/17/2016-71 year old female former smoker followed for OSA, Complicated by history bipolar, chronic back pain, GERD, obesity CPAP 9/[employer] FOLLOWS FOR: Wears CPAP every night for about 8-9 hours; DME [facility] Download shows good compliance. Complains that she gets short of breath with exertion so she doesn't try to exercise. Comfortable at rest. Comfortable when wearing CPAP. Office Spirometry 04/17/2016-moderate restriction of exhaled volume-FVC 1.47/55%, FEV1 1.21/60%, FEV1/FVC 0.82, FEF 25-75 percent 1.16.  04/22/17- 71 year old female former smoker followed for OSA, Complicated by history bipolar, chronic back pain, GERD, obesity CPAP 9/[facility] FOLLOW UP FOR  CPAP DME  is APRIA  patient is need of no supplies patient  has had a break on the machine for about 3 months due to surgery  Sheina dropped off of CPAP for cataract surgery and needs to get restarted. She does sleep better wearing her CPAP. Going to a nutritionist for help with weight loss. Breathing has been good with no exacerbations and no routine wheeze or cough.  ROS-see HPI Constitutional:   No-   weight loss, night sweats, fevers, chills, fatigue, lassitude. HEENT:   No-  headaches, difficulty swallowing, tooth/dental problems, sore throat,       No-  sneezing, itching, ear ache, nasal congestion, post nasal drip,  CV:  No-   chest pain, orthopnea, PND, swelling in lower extremities, anasarca, dizziness, palpitations Resp: +  shortness of breath with exertion or at rest.              No-   productive cough,  No non-productive cough,  No-  coughing up of blood.              No-   change in color of mucus.  No- wheezing.   Skin: No-   rash or lesions. GI:  No-   heartburn, indigestion, abdominal pain, nausea, vomiting,  GU:  MS:  No-   joint pain or swelling.   Neuro-     nothing unusual Psych:  No- change in mood or affect. No depression or anxiety.  No memory loss.  OBJ   stable baseline exam General- Alert, Oriented, Affect-appropriate, Distress- none acute; + morbidly obese. +cane Skin- rash-none, lesions- none, excoriation- none Lymphadenopathy- none Head- atraumatic            Eyes- Gross vision intact, PERRLA, conjunctivae clear secretions            Ears- Hearing, canals-normal            Nose- Clear, no-Septal dev, mucus, polyps, erosion, perforation             Throat- Mallampati III-IV , mucosa clear , drainage- none, tonsils- atrophic,                                                 +dentures Neck- flexible , trachea midline, no stridor , thyroid nl, carotid no bruit Chest - symmetrical excursion , unlabored           Heart/CV- RRR , no murmur , no gallop  , no rub, nl s1 s2                           -  JVD- none , edema- none, stasis changes- none, varices- none           Lung- clear to P&A, wheeze- none, cough- none , dullness-none, rub- none           Chest wall-  Abd-  Br/ Gen/ Rectal- Not done, not indicated Extrem- cyanosis- none, clubbing, none, atrophy- none, strength- nl. Surgical absence left middle finger. Neuro- +Tremor

## 2017-04-23 NOTE — Assessment & Plan Note (Signed)
I prompted her to get started back with her CPAP. She's had long enough after her cataract surgery. We also discussed basic comfort issues.

## 2017-04-23 NOTE — Assessment & Plan Note (Signed)
Seriously overweight, impacting our work with sleep apnea but also other problems such as her diabetes. Consider bariatric referral.

## 2017-06-15 ENCOUNTER — Telehealth: Payer: Self-pay | Admitting: Internal Medicine

## 2017-06-15 DIAGNOSIS — G4733 Obstructive sleep apnea (adult) (pediatric): Secondary | ICD-10-CM

## 2017-06-15 NOTE — Telephone Encounter (Signed)
Spoke with patient-aware that we are placing order for replacement CPAP with supplies to [place]. Pt is aware that we will contact her with any concerns/needs prior to getting CPAP machine.    Pt has been scheduled to see CY for f/u new CPAP machine on Monday 09/13/17 at 2:00pm. Nothing more needed at this time.

## 2017-09-13 ENCOUNTER — Other Ambulatory Visit (INDEPENDENT_AMBULATORY_CARE_PROVIDER_SITE_OTHER): Payer: Medicare HMO

## 2017-09-13 ENCOUNTER — Encounter: Payer: Self-pay | Admitting: Internal Medicine

## 2017-09-13 ENCOUNTER — Ambulatory Visit (INDEPENDENT_AMBULATORY_CARE_PROVIDER_SITE_OTHER): Payer: Medicare HMO | Admitting: Internal Medicine

## 2017-09-13 ENCOUNTER — Ambulatory Visit (INDEPENDENT_AMBULATORY_CARE_PROVIDER_SITE_OTHER)
Admission: RE | Admit: 2017-09-13 | Discharge: 2017-09-13 | Disposition: A | Discharge status: Home / Self Care | Payer: Medicare HMO | Source: Ambulatory Visit | Attending: Internal Medicine | Admitting: Internal Medicine

## 2017-09-13 VITALS — BP 98/78 | HR 91 | Ht 60.0 in | Wt 256.8 lb

## 2017-09-13 DIAGNOSIS — G4733 Obstructive sleep apnea (adult) (pediatric): Secondary | ICD-10-CM

## 2017-09-13 DIAGNOSIS — R0609 Other forms of dyspnea: Secondary | ICD-10-CM | POA: Diagnosis not present

## 2017-09-13 DIAGNOSIS — R072 Precordial pain: Secondary | ICD-10-CM | POA: Diagnosis not present

## 2017-09-13 DIAGNOSIS — R0600 Dyspnea, unspecified: Secondary | ICD-10-CM

## 2017-09-13 DIAGNOSIS — Z23 Encounter for immunization: Secondary | ICD-10-CM | POA: Diagnosis not present

## 2017-09-13 LAB — BRAIN NATRIURETIC PEPTIDE: Pro B Natriuretic peptide (BNP): 36 pg/mL (ref 0.0–100.0)

## 2017-09-13 LAB — BASIC METABOLIC PANEL
BUN: 14 mg/dL (ref 6–23)
CO2: 29 mEq/L (ref 19–32)
Calcium: 10.3 mg/dL (ref 8.4–10.5)
Chloride: 102 mEq/L (ref 96–112)
Creatinine, Ser: 1.32 mg/dL — ABNORMAL HIGH (ref 0.40–1.20)
GFR: 42.15 mL/min — ABNORMAL LOW (ref >60.00)
Glucose, Bld: 130 mg/dL — ABNORMAL HIGH (ref 70–99)
Potassium: 3.4 mEq/L — ABNORMAL LOW (ref 3.5–5.1)
Sodium: 142 mEq/L (ref 135–145)

## 2017-09-13 LAB — HEPATIC FUNCTION PANEL
ALT: 21 U/L (ref 0–35)
AST: 20 U/L (ref 0–37)
Albumin: 4.1 g/dL (ref 3.5–5.2)
Alkaline Phosphatase: 159 U/L — ABNORMAL HIGH (ref 39–117)
Bilirubin, Direct: 0.1 mg/dL (ref 0.0–0.3)
Total Bilirubin: 0.7 mg/dL (ref 0.2–1.2)
Total Protein: 7.7 g/dL (ref 6.0–8.3)

## 2017-09-13 LAB — CBC WITH DIFFERENTIAL/PLATELET
Basophils Absolute: 0.1 10*3/uL (ref 0.0–0.1)
Basophils Relative: 1 % (ref 0.0–3.0)
Eosinophils Absolute: 0.2 10*3/uL (ref 0.0–0.7)
Eosinophils Relative: 1.8 % (ref 0.0–5.0)
HCT: 42.4 % (ref 36.0–46.0)
Hemoglobin: 14.1 g/dL (ref 12.0–15.0)
Lymphocytes Relative: 23.9 % (ref 12.0–46.0)
Lymphs Abs: 2.8 10*3/uL (ref 0.7–4.0)
MCHC: 33.2 g/dL (ref 30.0–36.0)
MCV: 88.9 fl (ref 78.0–100.0)
Monocytes Absolute: 0.8 10*3/uL (ref 0.1–1.0)
Monocytes Relative: 6.9 % (ref 3.0–12.0)
Neutro Abs: 7.9 10*3/uL — ABNORMAL HIGH (ref 1.4–7.7)
Neutrophils Relative %: 66.4 % (ref 43.0–77.0)
Platelets: 284 10*3/uL (ref 150.0–400.0)
RBC: 4.77 Mil/uL (ref 3.87–5.11)
RDW: 14 % (ref 11.5–15.5)
WBC: 11.9 10*3/uL — ABNORMAL HIGH (ref 4.0–10.5)

## 2017-09-13 MED ORDER — GLYCOPYRROLATE-FORMOTEROL 9-4.8 MCG/ACT IN AERO: 1.0000 | INHALATION_SPRAY | Freq: Every day | RESPIRATORY_TRACT | 0 refills | Status: DC

## 2017-09-13 NOTE — Progress Notes (Signed)
HPI female former smoker followed for OSA, Complicated by history bipolar, chronic back pain, GERD, obesity NPSG 01/15/98 AHI 16/ hr, weight 220 lbs MSLT 02/28/98 5/0 Office Spirometry 04/17/2016-moderate restriction of exhaled volume-FVC 1.47/55%, FEV1 1.21/60%, FEV1/FVC 0.82, FEF 25-75% 1.16. Walk Test 09/13/17-really able to complete first lap of 185 feet apnea.  Resting heart rate 84/saturation 100%.  At the end of one lap resting heart rate 131 saturation 97% room air. -------------------------------------------------------------------------------------------------  09/13/17- 71 year old female former smoker followed for OSA, Complicated by history bipolar, chronic back pain, GERD, obesity CPAP 9/[facility] --Pt SOB & wheezing with exertion has worsen in last 3 months. Pt has dry cough, excessive sweating, with chest tightness. Pt doesn't have new cpap machine set up yet.  On 06/15/17 we had ordered replacement for her old CPAP machine through [facility] 5-[facility]. She has her new CPAP machine but not set up yet as she continues to use her old one.  She has been distracted by multiple deaths in the family.  Says she sleeps well on the CPAP machine. Has not smoked in years.  Complains it with any exertion she feels short of breath walking even a few 100 steps, breaks out in sweats.  Some nonradiating substernal pain with exertion relieved by rest. Walk Test 09/13/17-really able to complete first lap of 185 feet apnea.  Resting heart rate 84/saturation 100%.  At the end of one lap resting heart rate 131 saturation 97% room air.  ROS-see HPI + = positive Constitutional:   No-   weight loss, night sweats, fevers, chills, fatigue, lassitude. HEENT:   No-  headaches, difficulty swallowing, tooth/dental problems, sore throat,       No-  sneezing, itching, ear ache, nasal congestion, post nasal drip,  CV: + Chest pain, orthopnea, PND, swelling in lower extremities, anasarca, dizziness, palpitations Resp: +   shortness of breath with exertion or at rest.              No-   productive cough,  No non-productive cough,  No- coughing up of blood.              No-   change in color of mucus.  No- wheezing.   Skin: No-   rash or lesions. GI:  No-   heartburn, indigestion, abdominal pain, nausea, vomiting,  GU:  MS:  No-   joint pain or swelling.   Neuro-     nothing unusual Psych:  No- change in mood or affect. No depression or anxiety.  No memory loss.  OBJ   stable baseline exam General- Alert, Oriented, Affect-appropriate, Distress- none acute; + morbidly obese. +cane Skin- rash-none, lesions- none, excoriation- none Lymphadenopathy- none Head- atraumatic            Eyes- Gross vision intact, PERRLA, conjunctivae clear secretions            Ears- Hearing, canals-normal            Nose- Clear, no-Septal dev, mucus, polyps, erosion, perforation             Throat- Mallampati III-IV , mucosa clear , drainage- none, tonsils- atrophic,                                                 +dentures Neck- flexible , trachea midline, no stridor , thyroid nl, carotid no bruit Chest -  symmetrical excursion , unlabored           Heart/CV- RRR , no murmur , no gallop  , no rub, nl s1 s2                           - JVD +2 cm , edema- none, stasis changes- none, varices- none           Lung- clear to P&A, wheeze- none, cough- none , dullness-none, rub- none           Chest wall-  Abd-  Br/ Gen/ Rectal- Not done, not indicated Extrem- cyanosis- none, clubbing, none, atrophy- none, strength- nl. Surgical absence left middle finger. Neuro- +Tremor

## 2017-09-13 NOTE — Assessment & Plan Note (Signed)
She has not made time to get her new CPAP machine up and running, but she indicates good compliance and control with her 71 year old machine.

## 2017-09-13 NOTE — Patient Instructions (Addendum)
Order- flu vax - senior  Order- Walk test     Dx dyspnea on exertion  Order- CXR    Dx dyspnea on exertion  Order- lab- CBC w diff, BMET, hepatic function profile, BNP    Dx Dyspnea on exertion  Please tell Dr Garlon Hatchet that you are having chest pains when you exert yourself, and ask if he will refer you to a cardiologist in [place], since you live there and driving is not easy.  Sample Bevespi inhaler--    Inhale 1 or 2 puffs, twice daily. See if it helps your breathing.

## 2017-09-13 NOTE — Assessment & Plan Note (Signed)
Substernal chest pain with exertion, relieved by rest and possibly associated with dyspnea on exertion and sweats. She has not had an acute event by her report, but is high risk for coronary artery disease.  Transportation is difficult for her so I suggested she ask her PCP to refer her to a cardiologist in [place].. We will get a few basic labs drawn-CBC with differential, chemistry t, liver profile, BNP

## 2017-09-13 NOTE — Assessment & Plan Note (Addendum)
Peace with obesity hypoventilation syndrome and gross deconditioning.  Not finding a lot of reversible lung disease.  I am concerned about chest pain suggestive of angina with her. She describes very limited exercise tolerance, noting weakness, exertional substernal pain -We will try to get her directed to a cardiology evaluation in [facility]

## 2017-09-13 NOTE — Assessment & Plan Note (Signed)
She has not been willing to change lifestyle enough

## 2018-01-17 ENCOUNTER — Ambulatory Visit: Payer: Medicare HMO | Admitting: Internal Medicine

## 2018-02-11 ENCOUNTER — Ambulatory Visit: Payer: Medicare HMO | Admitting: Internal Medicine

## 2018-04-25 ENCOUNTER — Ambulatory Visit: Payer: Medicare HMO | Admitting: Internal Medicine

## 2018-07-18 ENCOUNTER — Ambulatory Visit: Payer: Medicaid Other | Admitting: Internal Medicine

## 2018-08-26 ENCOUNTER — Ambulatory Visit: Payer: Medicaid Other | Admitting: Internal Medicine

## 2018-08-30 IMAGING — DX DG CHEST 2V
2 series · 2 of 2 positions shown · non-contrast
Comparison: 09/29/2016

CLINICAL DATA: Chronic dyspnea on exertion.

EXAM:
CHEST  2 VIEW

[chest pa]
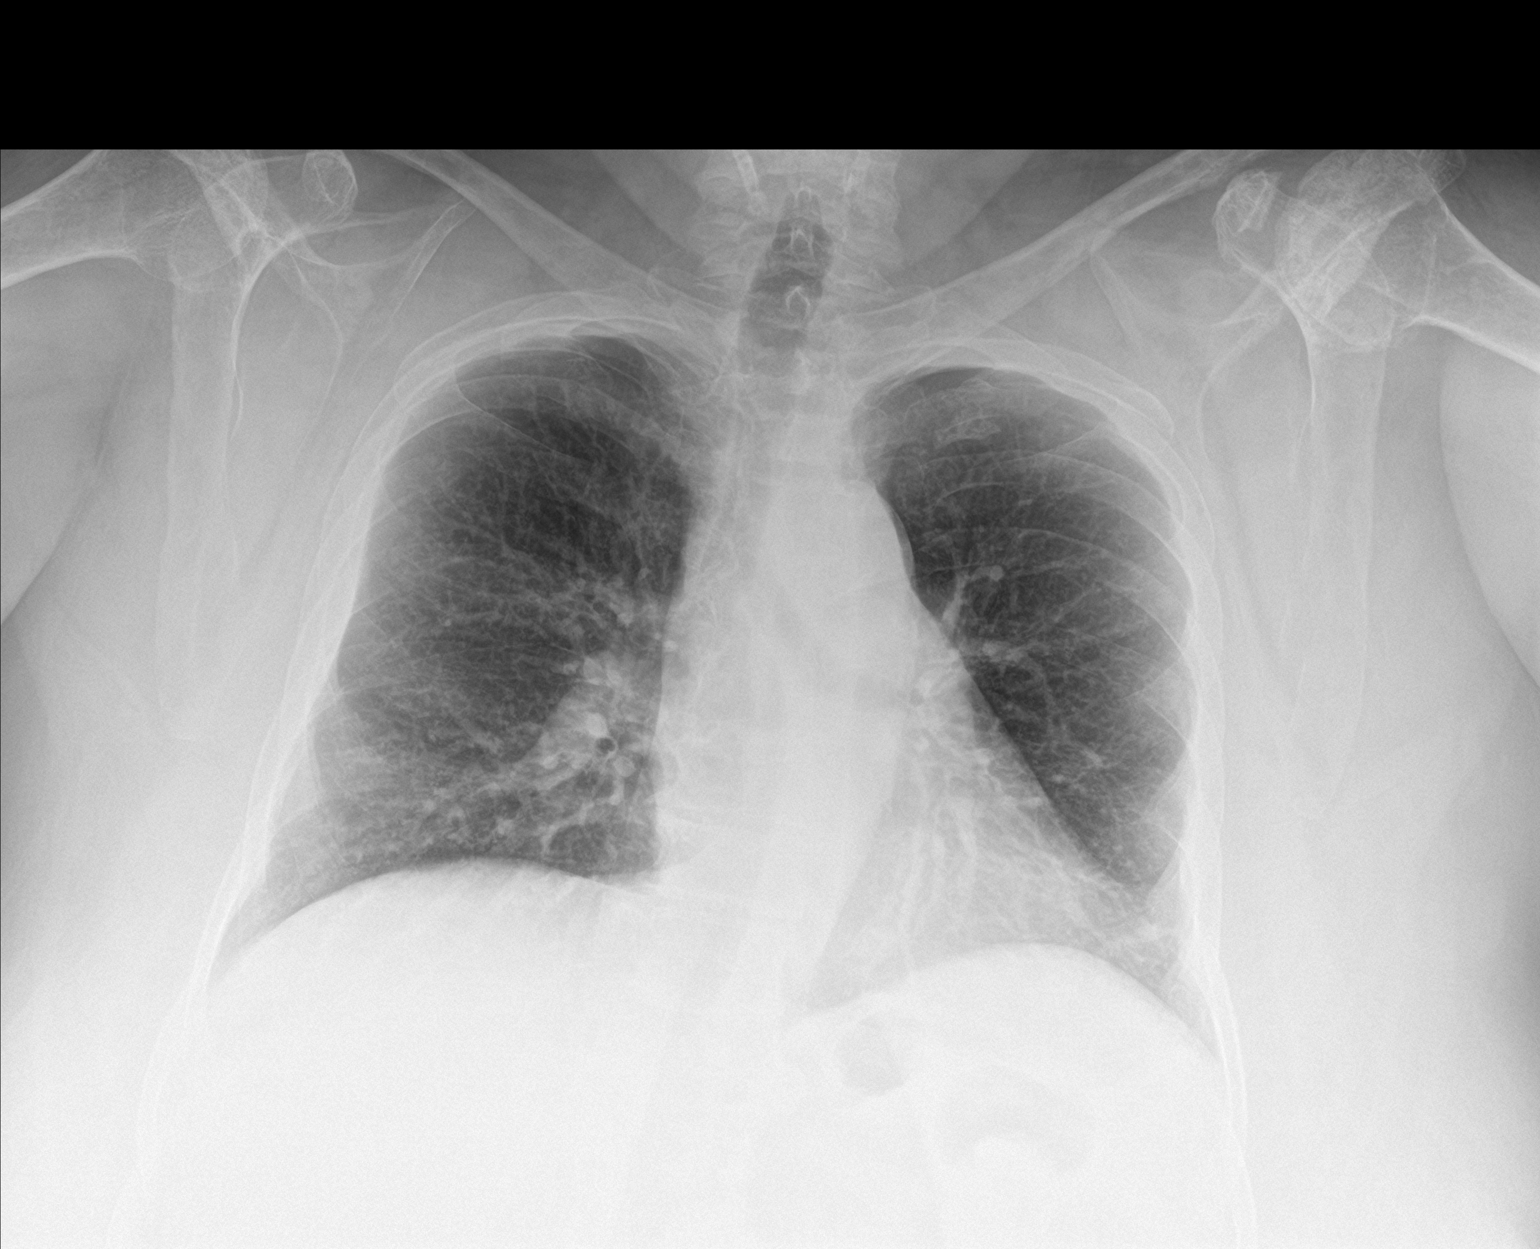

[chest lat]
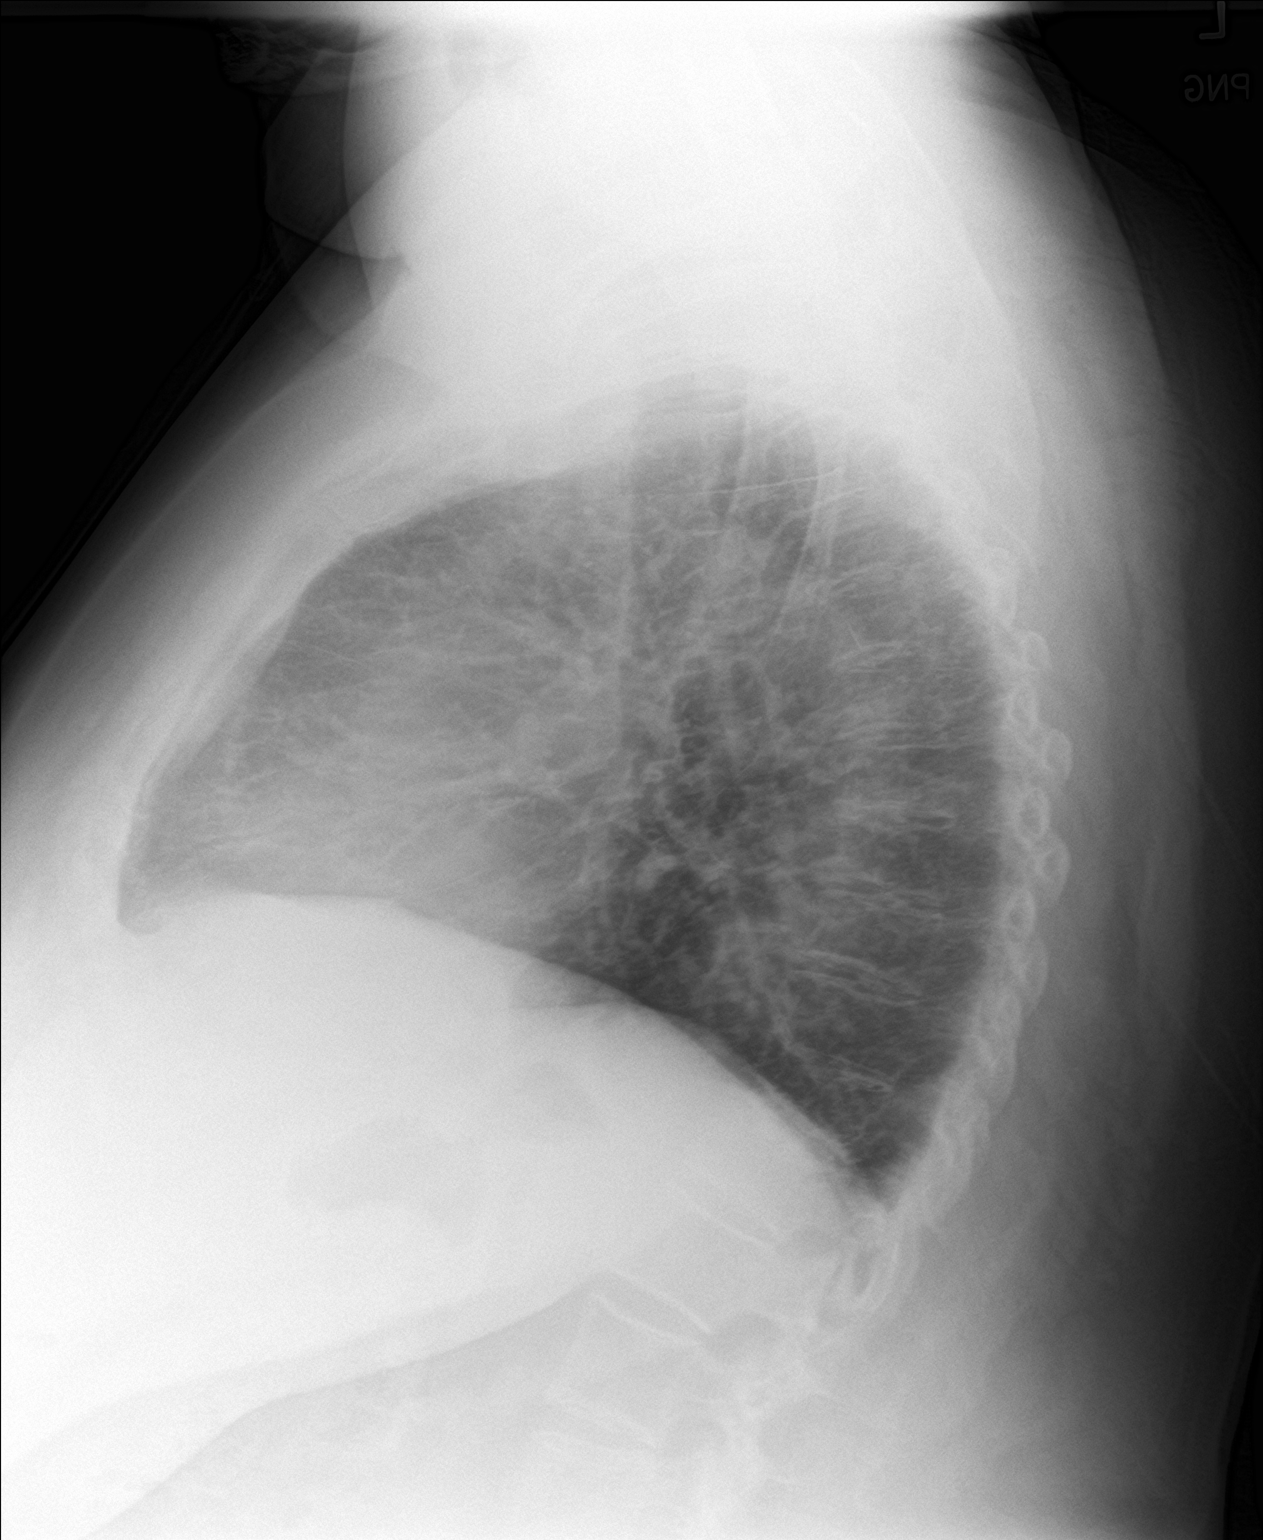

[2 of 2 positions shown; findings below may reference images not displayed]

FINDINGS: The heart size and mediastinal contours are within normal limits.
Both lungs are clear. Chronic compression deformity within the lower
thoracic spine appears unchanged.
IMPRESSION: No active cardiopulmonary disease.

## 2018-08-31 NOTE — Progress Notes (Signed)
@Patient  ID: Chelsea [name], female    DOB: [birth year], 72 y.o.   MRN: [id number]  No chief complaint on file.   Referring provider: Algis Greenhouse, MD  HPI:  72 year old female former smoker followed in our office for obstructive sleep apnea managed on CPAP   PMH: bipolar, chronic back pain, GERD, obesity Smoker/ Smoking History: Former smoker Maintenance:  Bevespi Inhaler  Pt of: Dr. Annamaria Boots  Recent  Pulmonary Encounters:   Last seen in 09/2017  09/01/2018  - Visit   72 year old female patient presenting today for follow-up visit to review obstructive sleep apnea as well as CPAP compliance.  Patient reports she is been doing well since last office visit.  Patient was last seen in November/2018.  Patient reports that she did feel that the Bevespi inhaler that she was prescribed at last office visit was helpful.  Patient is wondering if we have any more samples and would like to go on that chronically.  Patient admits that she forgot to contact our office for a prescription.    Patient and husband both report 100% compliance with CPAP therapy.  Patient reports that she cannot sleep without it.  Patient reports that she even uses her CPAP when this.  Unfortunately the card with her today she is not in [place] so were unable to obtain a CPAP compliance report to confirm this.  Patient has formal documentation from the [facility] that she is requesting that our office fill out regarding her respiratory as well as obstructive sleep apnea status.  Patient reports that this information is due on 09/27/2018.  Patient admits that she was recently treated at family practice a few weeks ago with Levaquin as well as a Depo-Medrol injection and a short prednisone taper.  Patient reports she is been feeling well unfortunately we do not have these records in our system.  Based off of her description it sounds like she was treated for pneumonia.  Patient will have primary care for these records to  our office as well as chest x-ray results that from that time.  Patient would like to receive the flu vaccine and primary care office       Tests:  NPSG 01/15/98 AHI 16/ hr, weight 220 lbs MSLT 02/28/98 5/0 Office Spirometry 04/17/2016-moderate restriction of exhaled volume-FVC 1.47/55%, FEV1 1.21/60%, FEV1/FVC 0.82, FEF 25-75% 1.16. Walk Test 09/13/17-really able to complete first lap of 185 feet apnea.  Resting heart rate 84/saturation 100%.  At the end of one lap resting heart rate 131 saturation 97% room air.  FENO:  No results found for: NITRICOXIDE  PFT: No flowsheet data found.  Imaging: No results found.  Chart Review:    Specialty Problems      Pulmonary Problems   Obstructive sleep apnea    NPSG 01/15/98 AHI 16/ hr, weight 220 lbs MSLT 02/28/98 5/0 CPAP        Lung nodule    Patient reports a right upper lobe nodule is being followed every 3 months at [facility]      Dyspnea on exertion    Office Spirometry 04/17/2016-moderate restriction of exhaled volume-FVC 1.47/55%, FEV1 1.21/60%, FEV1/FVC 0.82, FEF 25-75% 1.16. Walk Test 09/13/17-really able to complete first lap of 185 feet apnea.  Resting heart rate 84/saturation 100%.  At the end of one lap resting heart rate 131 saturation 97% room air.         Allergies  Allergen Reactions  . Oxycodone Other (See Comments)    Other reaction(s):  Hallucinations Hallucinations Hallucinations  . Tramadol Hives  . Diazepam Other (See Comments)    MOUTH SWELLING  . Penicillins Rash  . Sulfonamide Derivatives Rash    Immunization History  Administered Date(s) Administered  . Influenza Split 07/11/2011, 09/21/2012, 08/21/2013  . Influenza, High Dose Seasonal PF 09/13/2017  . Pneumococcal Polysaccharide-23 08/17/2017   Patient reports she will receive the flu vaccine at primary care  Past Medical History:  Diagnosis Date  . Anxiety   . Arthritis   . Bipolar disorder ([place])   . 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 Chronic  back pain   . Chronic kidney disease, stage III (moderate) (HCC)   . Chronic mycotic otitis externa   . Complication of anesthesia    "I GOT TOO MUCH MEDICINE AND I DIED TWICE" (ANESTHESIA NOTES ON CHART)  . Depression   . Depression   . Diabetes ([place])   . DM II (diabetes mellitus, type II), controlled ([place]) 09/22/11  . Dysphagia, oropharyngeal phase   . Essential hypertension, benign   . Essential hypertension, benign   . Exostosis of unspecified site   . GERD (gastroesophageal reflux disease)   . Headache(784.0)    MIGRAINES  . High cholesterol   . Major depressive disorder, recurrent episode, unspecified   . Migraine   . Obesity   . OSA (obstructive sleep apnea)    USES C-PAP  . Other ankle sprain and strain   . Other chronic infective otitis externa   . Other dyspnea and respiratory abnormality   . Other enthesopathy of ankle and tarsus   . Other nonspecific abnormal serum enzyme levels   . Other psoriasis   . Pain in joint, ankle and foot   . Post-traumatic seroma ([place])   . Presbyacusis   . Rash    L ARM  . Recurrent UTI (urinary tract infection)   . Restless legs syndrome (RLS)   . Secondary localized osteoarthrosis, lower leg   . Skin cancer    L HAND  . Subjective tinnitus   . Tobacco user   . Type II or unspecified type diabetes mellitus with renal manifestations, not stated as uncontrolled(250.40)   . Unspecified pruritic disorder   . Unspecified psychosis   . 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 Packs/day: 1.00  . Years: 9.00  . Pack years: 9.00  . Types: Cigarettes  . Last attempt to quit: 11/09/2005  . Years since quitting: 12.8  Smokeless Tobacco Never Used   Counseling given: Yes  Continue to not smoke  Outpatient Encounter Medications as of 09/01/2018  Medication Sig  . atorvastatin (LIPITOR) 40 MG tablet Take 40 mg by mouth daily.  . Clobetasol Propionate 0.05 % lotion     . donepezil (ARICEPT) 10 MG tablet Take 10 mg by mouth at bedtime.  . Fluocinolone Acetonide (DERMA-SMOOTHE/FS SCALP) 0.01 % OIL Apply topically.  Marland Kitchen glucose blood test strip 1 each by Other route as needed for other. Use as instructed  . insulin degludec (TRESIBA) 100 UNIT/ML SOPN FlexTouch Pen Inject 12 Units into the skin daily.  . Lancets Thin MISC by Does not apply route.  . Multiple Vitamin (MULTIVITAMIN) tablet Take 1 tablet by mouth daily.    Marland Kitchen OVER THE COUNTER MEDICATION Take 1 tablet by mouth daily. Mega life restless legs vitamin  . QUEtiapine (SEROQUEL) 300 MG tablet Take 200 mg by mouth at bedtime.   Marland Kitchen  sitaGLIPtin (JANUVIA) 100 MG tablet Take 100 mg by mouth daily.  . TRUEPLUS LANCETS 28G MISC by Does not apply route.  . venlafaxine XR (EFFEXOR-XR) 150 MG 24 hr capsule Take 150 mg by mouth daily with breakfast.   . vitamin C (ASCORBIC ACID) 500 MG tablet Take by mouth.  . beta carotene w/minerals (OCUVITE) tablet Take 1 tablet by mouth daily.    . cephALEXin (KEFLEX) 250 MG capsule Take 250 mg by mouth daily.  . Glycopyrrolate-Formoterol (BEVESPI AEROSPHERE) 9-4.8 MCG/ACT AERO Inhale 1 puff daily into the lungs. (Patient not taking: Reported on 09/01/2018)  . Glycopyrrolate-Formoterol (BEVESPI AEROSPHERE) 9-4.8 MCG/ACT AERO Inhale 2 puffs into the lungs 2 (two) times daily.  . memantine (NAMENDA) 10 MG tablet Take 10 mg by mouth 2 (two) times daily.  . nitrofurantoin (MACRODANTIN) 100 MG capsule   . pramipexole (MIRAPEX) 0.5 MG tablet Take 0.5 mg by mouth 3 (three) times daily.  . [DISCONTINUED] buPROPion (WELLBUTRIN SR) 150 MG 12 hr tablet Take 150 mg by mouth daily.  . [DISCONTINUED] ketoconazole (NIZORAL) 2 % shampoo Apply 1 application topically 2 (two) times a week.  . [DISCONTINUED] nystatin (MYCOSTATIN) 100000 UNIT/ML suspension   . [DISCONTINUED] nystatin cream (MYCOSTATIN) Apply topically.  . [DISCONTINUED] QUEtiapine (SEROQUEL) 50 MG tablet Take 50 mg by mouth at  bedtime.  . [DISCONTINUED] trimethoprim (TRIMPEX) 100 MG tablet Take 50 mg by mouth daily.   . [DISCONTINUED] venlafaxine XR (EFFEXOR-XR) 75 MG 24 hr capsule Take 75 mg by mouth daily before breakfast.   No facility-administered encounter medications on file as of 09/01/2018.      Review of Systems  Review of Systems  Constitutional: Negative for chills, fatigue, fever and unexpected weight change.  HENT: Negative for congestion, ear pain, postnasal drip, sinus pressure and sinus pain.   Respiratory: Negative for cough, chest tightness, shortness of breath and wheezing.   Cardiovascular: Negative for chest pain and palpitations.  Gastrointestinal: Negative for blood in stool, diarrhea, nausea and vomiting.  Genitourinary: Negative for dysuria, frequency and urgency.  Musculoskeletal: Negative for arthralgias.  Skin: Negative for color change.  Allergic/Immunologic: Negative for environmental allergies and food allergies.  Neurological: Negative for dizziness, light-headedness and headaches.  Psychiatric/Behavioral: Negative for dysphoric mood. The patient is not nervous/anxious.   All other systems reviewed and are negative.    Physical Exam  BP (!) 108/58 (BP Location: Left Arm, Cuff Size: Normal)   Pulse 85   Ht 5' (1.524 m)   Wt 251 lb 4.8 oz (114 kg)   SpO2 95%   BMI 49.08 kg/m   Wt Readings from Last 5 Encounters:  09/01/18 251 lb 4.8 oz (114 kg)  09/13/17 256 lb 12.8 oz (116.5 kg)  04/22/17 252 lb 6.4 oz (114.5 kg)  04/17/16 266 lb (120.7 kg)  04/19/15 227 lb (103 kg)    Physical Exam  Constitutional: She is oriented to person, place, and time and well-developed, well-nourished, and in no distress. No distress.  HENT:  Head: Normocephalic and atraumatic.  Right Ear: Hearing, tympanic membrane, external ear and ear canal normal.  Left Ear: Hearing, tympanic membrane, external ear and ear canal normal.  Nose: Mucosal edema present. Right sinus exhibits no  maxillary sinus tenderness and no frontal sinus tenderness. Left sinus exhibits no maxillary sinus tenderness and no frontal sinus tenderness.  Mouth/Throat: Uvula is midline and oropharynx is clear and moist. No oropharyngeal exudate.  mallampati IV  Eyes: Pupils are equal, round, and reactive to light.  Neck:  Normal range of motion. Neck supple. No JVD present.  Cardiovascular: Normal rate, regular rhythm and normal heart sounds.  Pulmonary/Chest: Effort normal and breath sounds normal. No accessory muscle usage. No respiratory distress. She has no decreased breath sounds. She has no wheezes. She has no rhonchi.  Abdominal: Soft. Bowel sounds are normal. There is no tenderness.  Musculoskeletal: Normal range of motion. She exhibits no edema.  Lymphadenopathy:    She has no cervical adenopathy.  Neurological: She is alert and oriented to person, place, and time. Gait normal.  Skin: Skin is warm and dry. She is not diaphoretic. No erythema.  Psychiatric: Mood, memory, affect and judgment normal.  Nursing note and vitals reviewed.     Lab Results:  CBC    Component Value Date/Time   WBC 11.9 (H) 09/13/2017 1509   RBC 4.77 09/13/2017 1509   HGB 14.1 09/13/2017 1509   HCT 42.4 09/13/2017 1509   PLT 284.0 09/13/2017 1509   MCV 88.9 09/13/2017 1509   MCH 28.0 03/16/2013 0528   MCHC 33.2 09/13/2017 1509   RDW 14.0 09/13/2017 1509   LYMPHSABS 2.8 09/13/2017 1509   MONOABS 0.8 09/13/2017 1509   EOSABS 0.2 09/13/2017 1509   BASOSABS 0.1 09/13/2017 1509    BMET    Component Value Date/Time   NA 142 09/13/2017 1509   NA 144 07/20/2014 1502   K 3.4 (L) 09/13/2017 1509   CL 102 09/13/2017 1509   CO2 29 09/13/2017 1509   GLUCOSE 130 (H) 09/13/2017 1509   BUN 14 09/13/2017 1509   BUN 18 07/20/2014 1502   CREATININE 1.32 (H) 09/13/2017 1509   CALCIUM 10.3 09/13/2017 1509   GFRNONAA 36 (L) 07/20/2014 1502   GFRAA 41 (L) 07/20/2014 1502    BNP No results found for:  BNP  ProBNP    Component Value Date/Time   PROBNP 36.0 09/13/2017 1509      Assessment & Plan:   Pleasant 72 year old female patient presenting today for follow-up visit CPAP compliance.  Unfortunately patient still has not been set up with CPAP.  Patient reports that CPAP is at the house but they do not had a start using her setting it up.  Patient reports an okay from has come out has contacted them to help.  Patient has been both confirm that every night she is using her 71 year old CPAP as she has 100% compliant to that.  We will place an order for [employer] to reach out to the patient as well as to go to their house to help set up with the CPAP.  We will also request a download.  Unfortunately we cannot sign her DMV paperwork as we do not have any CPAP compliance data recent used to be able to support her compliance.  We will bring patient back in 2 to 3 months with downloads.  Patient is to follow-up sooner if Huey Romans is not reaching out with her having difficulties adhering to CPAP therapy.  Obstructive sleep apnea We will place an order for [employer] to come out to help you with assisting with starting your new CPAP device as well as to help you with obtaining a download as we need this to complete your legal forms >>> If you have not heard from [place] and nobody has come out to help you set up the device please contact our office as well as the [place] contact information below  [place] contact information:  >>> Telephone number-[phone]   We recommend that you continue using  your CPAP daily >>>Keep up the hard work using your device >>> Goal should be wearing this for the entire night that you are sleeping, at least 4 to 6 hours  Remember:  . Do not drive or operate heavy machinery if tired or drowsy.  . Please notify the supply company and office if you are unable to use your device regularly due to missing supplies or machine being broken.  . Work on maintaining a healthy weight and  following your recommended nutrition plan  . Maintain proper daily exercise and movement  . Maintaining proper use of your device can also help improve management of other chronic illnesses such as: Blood pressure, blood sugars, and weight management.   BiPAP/ CPAP Cleaning:  >>>Clean weekly, with Dawn soap, and bottle brush.  Set up to air dry.  Follow-up with Dr. Annamaria Boots in 2 to 3 months   Dyspnea on exertion Restart Bevespi Aerosphere inhaler >>>2 puffs daily twice a day (4 puffs total daily) >>>This is not a rescue inhaler >>>You take this daily no matter what   >>> Contact our office to let us know how you are doing on the [place] and if you would like a prescription sent in  We will place an order for [employer] to come out to help you with assisting with starting your new CPAP device as well as to help you with obtaining a download as we need this to complete your legal forms >>> If you have not heard from [place] and nobody has come out to help you set up the device please contact our office as well as the [place] contact information below  [place] contact information:  >>> Telephone number-[phone]   We recommend that you continue using your CPAP daily >>>Keep up the hard work using your device >>> Goal should be wearing this for the entire night that you are sleeping, at least 4 to 6 hours  Remember:  . Do not drive or operate heavy machinery if tired or drowsy.  . Please notify the supply company and office if you are unable to use your device regularly due to missing supplies or machine being broken.  . Work on maintaining a healthy weight and following your recommended nutrition plan  . Maintain proper daily exercise and movement  . Maintaining proper use of your device can also help improve management of other chronic illnesses such as: Blood pressure, blood sugars, and weight management.   BiPAP/ CPAP Cleaning:  >>>Clean weekly, with Dawn soap, and bottle brush.  Set up to  air dry.   Follow up with PCP about getting records from last OV  >>>receive flu vaccine >>> Request office visit as well as last chest x-ray to be sent to our office  Follow-up with Dr. Annamaria Boots in 2 to 3 months      Lauraine Rinne, NP 09/01/2018

## 2018-09-01 ENCOUNTER — Ambulatory Visit (INDEPENDENT_AMBULATORY_CARE_PROVIDER_SITE_OTHER): Payer: Medicare Other | Admitting: Pulmonary Disease

## 2018-09-01 ENCOUNTER — Encounter: Payer: Self-pay | Admitting: Pulmonary Disease

## 2018-09-01 VITALS — BP 108/58 | HR 85 | Ht 60.0 in | Wt 251.3 lb

## 2018-09-01 DIAGNOSIS — R0609 Other forms of dyspnea: Secondary | ICD-10-CM | POA: Diagnosis not present

## 2018-09-01 DIAGNOSIS — R0600 Dyspnea, unspecified: Secondary | ICD-10-CM

## 2018-09-01 DIAGNOSIS — G4733 Obstructive sleep apnea (adult) (pediatric): Secondary | ICD-10-CM

## 2018-09-01 MED ORDER — GLYCOPYRROLATE-FORMOTEROL 9-4.8 MCG/ACT IN AERO: 2.0000 | INHALATION_SPRAY | Freq: Two times a day (BID) | RESPIRATORY_TRACT | 0 refills | Status: AC

## 2018-09-01 NOTE — Assessment & Plan Note (Signed)
We will place an order for [employer] to come out to help you with assisting with starting your new CPAP device as well as to help you with obtaining a download as we need this to complete your legal forms >>> If you have not heard from [place] and nobody has come out to help you set up the device please contact our office as well as the [place] contact information below  [place] contact information:  >>> Telephone number-[phone]   We recommend that you continue using your CPAP daily >>>Keep up the hard work using your device >>> Goal should be wearing this for the entire night that you are sleeping, at least 4 to 6 hours  Remember:  . Do not drive or operate heavy machinery if tired or drowsy.  . Please notify the supply company and office if you are unable to use your device regularly due to missing supplies or machine being broken.  . Work on maintaining a healthy weight and following your recommended nutrition plan  . Maintain proper daily exercise and movement  . Maintaining proper use of your device can also help improve management of other chronic illnesses such as: Blood pressure, blood sugars, and weight management.   BiPAP/ CPAP Cleaning:  >>>Clean weekly, with Dawn soap, and bottle brush.  Set up to air dry.  Follow-up with Dr. Annamaria Boots in 2 to 3 months

## 2018-09-01 NOTE — Patient Instructions (Addendum)
Restart Bevespi Aerosphere inhaler >>>2 puffs daily twice a day (4 puffs total daily) >>>This is not a rescue inhaler >>>You take this daily no matter what   We will place an order for [employer] to come out to help you with assisting with starting your new CPAP device as well as to help you with obtaining a download as we need this to complete your legal forms >>> If you have not heard from [place] and nobody has come out to help you set up the device please contact our office as well as the [place] contact information below  [place] contact information:  >>> Telephone number-[phone]   We recommend that you continue using your CPAP daily >>>Keep up the hard work using your device >>> Goal should be wearing this for the entire night that you are sleeping, at least 4 to 6 hours  Remember:  . Do not drive or operate heavy machinery if tired or drowsy.  . Please notify the supply company and office if you are unable to use your device regularly due to missing supplies or machine being broken.  . Work on maintaining a healthy weight and following your recommended nutrition plan  . Maintain proper daily exercise and movement  . Maintaining proper use of your device can also help improve management of other chronic illnesses such as: Blood pressure, blood sugars, and weight management.   BiPAP/ CPAP Cleaning:  >>>Clean weekly, with Dawn soap, and bottle brush.  Set up to air dry.   Follow up with PCP about getting records from last OV  >>>receive flu vaccine >>> Request office visit as well as last chest x-ray to be sent to our office    Follow-up with Dr. Annamaria Boots in 2 to 3 months   November/2019 we will be moving! We will no longer be at our [place] location.  Be on the look out for a post card/mailer to let you know we have officially moved.  Our new address and phone number will be:  [address] [place], [place] [address] Telephone number: [phone]  It is flu season:    >>>Remember to be washing your hands regularly, using hand sanitizer, be careful to use around herself with has contact with people who are sick will increase her chances of getting sick yourself. >>> Best ways to protect herself from the flu: Receive the yearly flu vaccine, practice good hand hygiene washing with soap and also using hand sanitizer when available, eat a nutritious meals, get adequate rest, hydrate appropriately   Please contact the office if your symptoms worsen or you have concerns that you are not improving.   Thank you for choosing [facility] for your healthcare, and for allowing [place] to partner with you on your healthcare journey. I am thankful to be able to provide care to you today.   Wyn Quaker FNP-C      Sleep Apnea Sleep apnea is a condition that affects breathing. People with sleep apnea have moments during sleep when their breathing pauses briefly or gets shallow. Sleep apnea can cause these symptoms:  Trouble staying asleep.  Sleepiness or tiredness during the day.  Irritability.  Loud snoring.  Morning headaches.  Trouble concentrating.  Forgetting things.  Less interest in sex.  Being sleepy for no reason.  Mood swings.  Personality changes.  Depression.  Waking up a lot during the night to pee (urinate).  Dry mouth.  Sore throat.  Follow these instructions at home:  Make  any changes in your routine that your doctor recommends.  Eat a healthy, well-balanced diet.  Take over-the-counter and prescription medicines only as told by your doctor.  Avoid using alcohol, calming medicines (sedatives), and narcotic medicines.  Take steps to lose weight if you are overweight.  If you were given a machine (device) to use while you sleep, use it only as told by your doctor.  Do not use any tobacco products, such as cigarettes, chewing tobacco, and e-cigarettes. If you need help quitting, ask your doctor.  Keep all follow-up  visits as told by your doctor. This is important. Contact a doctor if:  The machine that you were given to use during sleep is uncomfortable or does not seem to be working.  Your symptoms do not get better.  Your symptoms get worse. Get help right away if:  Your chest hurts.  You have trouble breathing in enough air (shortness of breath).  You have an uncomfortable feeling in your back, arms, or stomach.  You have trouble talking.  One side of your body feels weak.  A part of your face is hanging down (drooping). These symptoms may be an emergency. Do not wait to see if the symptoms will go away. Get medical help right away. Call your local emergency services (911 in the [place] Do not drive yourself to the hospital. This information is not intended to replace advice given to you by your health care provider. Make sure you discuss any questions you have with your health care provider. Document Released: 08/04/2008 Document Revised: 06/21/2016 Document Reviewed: 08/05/2015 Elsevier Interactive Patient Education  2018 [employer].     CPAP and BiPAP Information CPAP and BiPAP are methods of helping a person breathe with the use of air pressure. CPAP stands for "continuous positive airway pressure." BiPAP stands for "bi-level positive airway pressure." In both methods, air is blown through your nose or mouth and into your air passages to help you breathe well. CPAP and BiPAP use different amounts of pressure to blow air. With CPAP, the amount of pressure stays the same while you breathe in and out. With BiPAP, the amount of pressure is increased when you breathe in (inhale) so that you can take larger breaths. Your health care provider will recommend whether CPAP or BiPAP would be more helpful for you. Why are CPAP and BiPAP treatments used? CPAP or BiPAP can be helpful if you have:  Sleep apnea.  Chronic obstructive pulmonary disease (COPD).  Heart failure.  Medical conditions  that weaken the muscles of the chest including muscular dystrophy, or neurological diseases such as amyotrophic lateral sclerosis (ALS).  Other problems that cause breathing to be weak, abnormal, or difficult.  CPAP is most commonly used for obstructive sleep apnea (OSA) to keep the airways from collapsing when the muscles relax during sleep. How is CPAP or BiPAP administered? Both CPAP and BiPAP are provided by a small machine with a flexible plastic tube that attaches to a plastic mask. You wear the mask. Air is blown through the mask into your nose or mouth. The amount of pressure that is used to blow the air can be adjusted on the machine. Your health care provider will determine the pressure setting that should be used based on your individual needs. When should CPAP or BiPAP be used? In most cases, the mask only needs to be worn during sleep. Generally, the mask needs to be worn throughout the night and during any daytime naps. People with certain  medical conditions may also need to wear the mask at other times when they are awake. Follow instructions from your health care provider about when to use the machine. What are some tips for using the mask?  Because the mask needs to be snug, some people feel trapped or closed-in (claustrophobic) when first using the mask. If you feel this way, you may need to get used to the mask. One way to do this is by holding the mask loosely over your nose or mouth and then gradually applying the mask more snugly. You can also gradually increase the amount of time that you use the mask.  Masks are available in various types and sizes. Some fit over your mouth and nose while others fit over just your nose. If your mask does not fit well, talk with your health care provider about getting a different one.  If you are using a mask that fits over your nose and you tend to breathe through your mouth, a chin strap may be applied to help keep your mouth closed.  The  CPAP and BiPAP machines have alarms that may sound if the mask comes off or develops a leak.  If you have trouble with the mask, it is very important that you talk with your health care provider about finding a way to make the mask easier to tolerate. Do not stop using the mask. Stopping the use of the mask could have a negative impact on your health. What are some tips for using the machine?  Place your CPAP or BiPAP machine on a secure table or stand near an electrical outlet.  Know where the on/off switch is located on the machine.  Follow instructions from your health care provider about how to set the pressure on your machine and when you should use it.  Do not eat or drink while the CPAP or BiPAP machine is on. Food or fluids could get pushed into your lungs by the pressure of the CPAP or BiPAP.  Do not smoke. Tobacco smoke residue can damage the machine.  For home use, CPAP and BiPAP machines can be rented or purchased through home health care companies. Many different brands of machines are available. Renting a machine before purchasing may help you find out which particular machine works well for you.  Keep the CPAP or BiPAP machine and attachments clean. Ask your health care provider for specific instructions. Get help right away if:  You have redness or open areas around your nose or mouth where the mask fits.  You have trouble using the CPAP or BiPAP machine.  You cannot tolerate wearing the CPAP or BiPAP mask.  You have pain, discomfort, and bloating in your abdomen. Summary  CPAP and BiPAP are methods of helping a person breathe with the use of air pressure.  Both CPAP and BiPAP are provided by a small machine with a flexible plastic tube that attaches to a plastic mask.  If you have trouble with the mask, it is very important that you talk with your health care provider about finding a way to make the mask easier to tolerate. This information is not intended to  replace advice given to you by your health care provider. Make sure you discuss any questions you have with your health care provider. Document Released: 07/24/2004 Document Revised: 09/14/2016 Document Reviewed: 09/14/2016 Elsevier Interactive Patient Education  2017 [employer].

## 2018-09-01 NOTE — Assessment & Plan Note (Addendum)
Restart Bevespi Aerosphere inhaler >>>2 puffs daily twice a day (4 puffs total daily) >>>This is not a rescue inhaler >>>You take this daily no matter what   >>> Contact our office to let us know how you are doing on the [place] and if you would like a prescription sent in  We will place an order for [employer] to come out to help you with assisting with starting your new CPAP device as well as to help you with obtaining a download as we need this to complete your legal forms >>> If you have not heard from [place] and nobody has come out to help you set up the device please contact our office as well as the [place] contact information below  [place] contact information:  >>> Telephone number-[phone]   We recommend that you continue using your CPAP daily >>>Keep up the hard work using your device >>> Goal should be wearing this for the entire night that you are sleeping, at least 4 to 6 hours  Remember:  . Do not drive or operate heavy machinery if tired or drowsy.  . Please notify the supply company and office if you are unable to use your device regularly due to missing supplies or machine being broken.  . Work on maintaining a healthy weight and following your recommended nutrition plan  . Maintain proper daily exercise and movement  . Maintaining proper use of your device can also help improve management of other chronic illnesses such as: Blood pressure, blood sugars, and weight management.   BiPAP/ CPAP Cleaning:  >>>Clean weekly, with Dawn soap, and bottle brush.  Set up to air dry.   Follow up with PCP about getting records from last OV  >>>receive flu vaccine >>> Request office visit as well as last chest x-ray to be sent to our office  Follow-up with Dr. Annamaria Boots in 2 to 3 months

## 2018-09-06 ENCOUNTER — Telehealth: Payer: Self-pay | Admitting: Internal Medicine

## 2018-09-06 NOTE — Telephone Encounter (Signed)
Disc handed to Chelsea [name], message sent to Dr. Annamaria Boots. Chelsea [name] will attach message to disc for Dr. Annamaria Boots.

## 2018-09-16 ENCOUNTER — Telehealth: Payer: Self-pay | Admitting: Internal Medicine

## 2018-09-16 NOTE — Telephone Encounter (Signed)
Called patient unable to reach left message to give us a call back.

## 2018-09-19 NOTE — Telephone Encounter (Signed)
Spoke with pt, and made an appt for 10/31/2018 at 12/23 at 11:30am. Nothing further is needed.

## 2018-09-19 NOTE — Telephone Encounter (Signed)
Any RNA slot is okay to use. Thanks.

## 2018-09-19 NOTE — Telephone Encounter (Signed)
Please advise where we can work the pt in before 11/17/18. Thanks.

## 2018-10-31 ENCOUNTER — Ambulatory Visit (INDEPENDENT_AMBULATORY_CARE_PROVIDER_SITE_OTHER): Payer: Medicare Other | Admitting: Internal Medicine

## 2018-10-31 ENCOUNTER — Encounter: Payer: Self-pay | Admitting: Internal Medicine

## 2018-10-31 VITALS — BP 112/68 | HR 112 | Ht 60.0 in | Wt 251.0 lb

## 2018-10-31 DIAGNOSIS — R0609 Other forms of dyspnea: Secondary | ICD-10-CM

## 2018-10-31 DIAGNOSIS — G4733 Obstructive sleep apnea (adult) (pediatric): Secondary | ICD-10-CM

## 2018-10-31 DIAGNOSIS — R911 Solitary pulmonary nodule: Secondary | ICD-10-CM | POA: Diagnosis not present

## 2018-10-31 DIAGNOSIS — R0600 Dyspnea, unspecified: Secondary | ICD-10-CM

## 2018-10-31 NOTE — Progress Notes (Signed)
Patient ID: Chelsea [name], female   DOB: 19-Feb-1946, 72 y.o.   MRN: [id number]  HPI female former smoker followed for OSA, Complicated by history bipolar/ depression, chronic back pain, GERD, obesity, DM2 NPSG 01/15/98 AHI 16/ hr, weight 220 lbs MSLT 02/28/98 5/0 Office Spirometry 04/17/2016-moderate restriction of exhaled volume-FVC 1.47/55%, FEV1 1.21/60%, FEV1/FVC 0.82, FEF 25-75% 1.16. Walk Test 09/13/17-really able to complete first lap of 185 feet apnea.  Resting heart rate 84/saturation 100%.  At the end of one lap resting heart rate 131 saturation 97% room air. -------------------------------------------------------------------------------------------------  09/13/17- 73 year old female former smoker followed for OSA, Complicated by history bipolar, chronic back pain, GERD, obesity CPAP 9/[employer] --Pt SOB & wheezing with exertion has worsen in last 3 months. Pt has dry cough, excessive sweating, with chest tightness. Pt doesn't have new cpap machine set up yet.  On 06/15/17 we had ordered replacement for her old CPAP machine through [facility]-auto 5-15. She has her new CPAP machine but not set up yet as she continues to use her old one.  She has been distracted by multiple deaths in the family.  Says she sleeps well on the CPAP machine. Has not smoked in years.  Complains it with any exertion she feels short of breath walking even a few 100 steps, breaks out in sweats.  Some nonradiating substernal pain with exertion relieved by rest. Walk Test 09/13/17-really able to complete first lap of 185 feet apnea.  Resting heart rate 84/saturation 100%.  At the end of one lap resting heart rate 131 saturation 97% room air.  10/31/2018- 72 year old female former smoker followed for OSA, Complicated by history bipolar/ depression, chronic back pain, GERD, obesity, DM2 CPAP auto 5-15/[employer] Download 97% compliance AHI 1.5/hour      OSA: DME [employer] Pt wears CPAP nightly and DL attached. Will need order for new tubing  placed. Body weight today 251 pounds She reports sleeping much better and husband says she seems to have more energy, using CPAP.  She is comfortable with the pressure but no longer snores. Breathing is stable.  Admits to dyspnea and a little end expiratory wheeze if she is trying to exert herself.  She blames this on her weight.  Bevespi definitely has helped.  ROS-see HPI + = positive Constitutional:   No-   weight loss, night sweats, fevers, chills, fatigue, lassitude. HEENT:   No-  headaches, difficulty swallowing, tooth/dental problems, sore throat,       No-  sneezing, itching, ear ache, nasal congestion, post nasal drip,  CV: + Chest pain, orthopnea, PND, swelling in lower extremities, anasarca, dizziness, palpitations Resp: +  shortness of breath with exertion or at rest.              No-   productive cough,  No non-productive cough,  No- coughing up of blood.              No-   change in color of mucus.  + wheezing.   Skin: No-   rash or lesions. GI:  No-   heartburn, indigestion, abdominal pain, nausea, vomiting,  GU:  MS:  No-   joint pain or swelling.   Neuro-     nothing unusual Psych:  No- change in mood or affect. No depression or anxiety.  No memory loss.  OBJ    General- Alert, Oriented, Affect-appropriate, Distress- none acute; + morbidly obese. +cane Skin- rash-none, lesions- none, excoriation- none Lymphadenopathy- none Head- atraumatic  Eyes- Gross vision intact, PERRLA, conjunctivae clear secretions            Ears- Hearing, canals-normal            Nose- Clear, no-Septal dev, mucus, polyps, erosion, perforation             Throat- Mallampati III-IV , mucosa clear , drainage- none, tonsils- atrophic,                                             +dentures Neck- flexible , trachea midline, no stridor , thyroid nl, carotid no bruit Chest - symmetrical excursion , unlabored           Heart/CV- RRR , no murmur , no gallop  , no rub, nl s1 s2                            - JVD +1 cm , edema- none, stasis changes- none, varices- none           Lung-  wheeze+ end expiratory, cough- none , dullness-none, rub- none           Chest wall-  Abd-  Br/ Gen/ Rectal- Not done, not indicated Extrem- cyanosis- none, clubbing, none, atrophy- none, strength- nl. Surgical absence left middle finger. Neuro- +Tremor

## 2018-10-31 NOTE — Patient Instructions (Signed)
Order- DME [facility]- please replace tubing, supplies, mask of choice, headgear, Continue CPAP auto 5-15  Please call if we can help  DMV form completed

## 2018-11-09 NOTE — Assessment & Plan Note (Signed)
Followed elsewhere.  I would like to review outside CXR when available.

## 2018-11-09 NOTE — Assessment & Plan Note (Signed)
Exercise tolerance is unchanged and consistent with her previous smoking history, obesity and deconditioning.  She has exercise equipment and expresses intention to start exercising, but has not brought the exercise equipment into her home.

## 2018-11-09 NOTE — Assessment & Plan Note (Signed)
She continues to benefit from CPAP with improved sleep as confirmed by her husband.  Download confirms good compliance and control. Plan-continue AutoP[employer] 5-15

## 2018-12-05 ENCOUNTER — Ambulatory Visit: Payer: Medicare Other | Admitting: Internal Medicine

## 2018-12-27 ENCOUNTER — Telehealth: Payer: Self-pay | Admitting: Internal Medicine

## 2018-12-27 ENCOUNTER — Encounter: Payer: Self-pay | Admitting: Internal Medicine

## 2018-12-27 NOTE — Telephone Encounter (Signed)
Called and spoke with patient she verbalized understanding  

## 2018-12-27 NOTE — Telephone Encounter (Signed)
Letter is being mailed to her

## 2018-12-27 NOTE — Progress Notes (Signed)
                                Dear Ms Jessie,  This is to confirm that you are under our care for management of obstructive sleep apnea. As of our most recent visit on 10/31/18, you and your husband reported that by successfully using CPAP, you are sleeping better, not snoring, and no longer drowsy in the daytime.  The download from your CPAP confirmed this by indicating that you were meeting or exceeding usage goals 97% of the time, with a breakthrough AHI of only 1.5 apneas/ hour.  I have no basis for restricting your driving. Of course it remains up to you to be alert and safe while driving.  Signed Harper Smoker D. Annamaria Boots, MD, FASD

## 2018-12-27 NOTE — Telephone Encounter (Signed)
Pt wanted to give the fax number for the [facility] [phone] Pt number [phone]

## 2018-12-27 NOTE — Telephone Encounter (Signed)
Noted, thank you!   [place] please advise.  Thanks!

## 2018-12-27 NOTE — Telephone Encounter (Signed)
Spoke with pt, states that she needs a statement from [place] that her OSA is well-controlled and she is cleared to drive from a pulmonary standpoint to drive.  Also needs a download of her cpap to be mailed with this.   Mail to: [facility] DMV Division of Medical Review Branch   [place] [phone]  [place] please advise on letter.  Thanks!

## 2018-12-28 ENCOUNTER — Encounter: Payer: Self-pay | Admitting: Internal Medicine

## 2019-09-22 DIAGNOSIS — R0600 Dyspnea, unspecified: Secondary | ICD-10-CM

## 2019-11-13 ENCOUNTER — Ambulatory Visit: Payer: Medicare Other | Admitting: Internal Medicine

## 2020-12-02 ENCOUNTER — Ambulatory Visit (INDEPENDENT_AMBULATORY_CARE_PROVIDER_SITE_OTHER): Payer: Medicare Other | Admitting: Podiatry

## 2020-12-02 DIAGNOSIS — Z5329 Procedure and treatment not carried out because of patient's decision for other reasons: Secondary | ICD-10-CM

## 2020-12-02 NOTE — Progress Notes (Signed)
No show for appt.
